# Patient Record
Sex: Female | Born: 1985 | Race: White | Hispanic: No | Marital: Married | Smoking: Never smoker
Health system: Southern US, Community
[De-identification: ages and names within clinical notes are randomized; demographics above are authoritative.]

## PROBLEM LIST (undated history)

## (undated) ENCOUNTER — Inpatient Hospital Stay (HOSPITAL_COMMUNITY): Payer: Self-pay

## (undated) DIAGNOSIS — Z789 Other specified health status: Secondary | ICD-10-CM

## (undated) DIAGNOSIS — J45909 Unspecified asthma, uncomplicated: Secondary | ICD-10-CM

## (undated) HISTORY — PX: NO PAST SURGERIES: SHX2092

## (undated) HISTORY — DX: Other specified health status: Z78.9

---

## 2017-05-16 NOTE — L&D Delivery Note (Addendum)
Patient is 32 y.o. G1P0 4853w2d admitted 7/14 S/p IOL with foley bulb, followed by Pitocin. AROM at 1737.  Prenatal course also complicated by fetal abnormality (cavum vergae).  Delivery Note At 6:28 PM a viable female was delivered via Vaginal, Spontaneous cephalic, OA delivery  APGAR: 8, 9; weight  pending.   Placenta status: intact.Cord: 3 vessel.  Head delivered OA. No nuchal cord present. Cord wrapped around both feet, untangled after delivery. Shoulder and body delivered in usual fashion. Infant with spontaneous cry, placed on mother's abdomen, dried and bulb suctioned. Cord clamped x 2 after 1-minute delay, and cut by FOB. Cord blood drawn. Placenta delivered spontaneously with gentle cord traction. Fundus firm with massage and Pitocin. Perineum inspected and found to have first degree laceration, which was repaired with 3.0 monocryl with good hemostasis achieved.   Anesthesia:  none Episiotomy: None Lacerations: 1st degree;Perineal;right Labial Suture Repair: 3.0 monocryl. 4.0 monocryl Est. Blood Loss (mL):  200  Mom to postpartum.  Baby to Couplet care / Skin to Skin.  Sandre KittyDaniel K Olson 11/26/2017, 7:27 PM  OB FELLOW DELIVERY ATTESTATION  I was gloved and present for the delivery in its entirety, and I agree with the above resident's note.    Frederik PearJulie P Degele, MD OB Fellow

## 2017-05-22 ENCOUNTER — Encounter: Payer: Self-pay | Admitting: Obstetrics & Gynecology

## 2017-05-22 ENCOUNTER — Ambulatory Visit (INDEPENDENT_AMBULATORY_CARE_PROVIDER_SITE_OTHER): Payer: Managed Care, Other (non HMO) | Admitting: Obstetrics & Gynecology

## 2017-05-22 VITALS — BP 124/64 | HR 65 | Ht 67.5 in | Wt 176.0 lb

## 2017-05-22 DIAGNOSIS — O9989 Other specified diseases and conditions complicating pregnancy, childbirth and the puerperium: Secondary | ICD-10-CM

## 2017-05-22 DIAGNOSIS — Z3687 Encounter for antenatal screening for uncertain dates: Secondary | ICD-10-CM

## 2017-05-22 DIAGNOSIS — Z3481 Encounter for supervision of other normal pregnancy, first trimester: Secondary | ICD-10-CM | POA: Diagnosis not present

## 2017-05-22 DIAGNOSIS — O0990 Supervision of high risk pregnancy, unspecified, unspecified trimester: Secondary | ICD-10-CM | POA: Insufficient documentation

## 2017-05-22 DIAGNOSIS — Z113 Encounter for screening for infections with a predominantly sexual mode of transmission: Secondary | ICD-10-CM | POA: Diagnosis not present

## 2017-05-22 DIAGNOSIS — Z3490 Encounter for supervision of normal pregnancy, unspecified, unspecified trimester: Secondary | ICD-10-CM

## 2017-05-22 DIAGNOSIS — B373 Candidiasis of vulva and vagina: Secondary | ICD-10-CM | POA: Diagnosis not present

## 2017-05-22 DIAGNOSIS — B3731 Acute candidiasis of vulva and vagina: Secondary | ICD-10-CM

## 2017-05-22 DIAGNOSIS — Z3480 Encounter for supervision of other normal pregnancy, unspecified trimester: Secondary | ICD-10-CM

## 2017-05-22 MED ORDER — CLOTRIMAZOLE 1 % VA CREA: 1.0000 | TOPICAL_CREAM | Freq: Every day | VAGINAL | 1 refills | Status: DC

## 2017-05-22 NOTE — Patient Instructions (Signed)

## 2017-05-22 NOTE — Progress Notes (Signed)
DATING AND VIABILITY SONOGRAM   Erica Shelton is a 32 y.o. year old G1P0 with LMP Patient's last menstrual period was 02/17/2017 (exact date). which would correlate to  2254w3d weeks gestation.  She has regular menstrual cycles.   She is here today for a confirmatory initial sonogram.    GESTATION: SINGLETON     FETAL ACTIVITY:          Heart rate        156          The fetus is active.   ADNEXA: The ovaries are normal.   GESTATIONAL AGE AND  BIOMETRICS:  Gestational criteria: Estimated Date of Delivery: 11/24/17 by early ultrasound now at 7730w1d  Previous Scans:0  CROWN RUMP LENGTH           mm     13-1 weeks                                                                               AVERAGE EGA(BY THIS SCAN):  13-1 weeks  WORKING EDD( early ultrasound ):  11-24-17    Erica StammerJennifer Mckinnon Glick RN 05-22-17 8:49 am   Attestation of Attending Supervision of RN: Evaluation and management procedures were performed by the nurse under my supervision and collaboration.  I have reviewed the nursing note and chart, and I agree with the management and plan.  Erica Shelton, M.D., FACOG  .

## 2017-05-22 NOTE — Progress Notes (Signed)
  Subjective:    Erica Shelton is being seen today for her first obstetrical visit. G1 LMP Feb 17, 2017.  Pt has 28 day cycles    This is not a planned pregnancy.But, pt is excited about it She is at 2926w3d gestation. Her obstetrical history is significant for no problems. Relationship with FOB: significant other, living together. Patient  pt is considering breast feedign vs pumping.  intend to breast feed. Pregnancy history fully reviewed.  FOB: healthy. No children.   Patient reports vaginal irritation and discharge.  Pt was in PT at the age of 18 years for hp pain. She was told that if she was ever on birth control she may need a brace.     Review of Systems:   Review of Systems [place]; pt exercises a lot  Objective:     BP 124/64   Pulse 65   Ht 5' 7.5" (1.715 m)   Wt 176 lb (79.8 kg)   LMP 02/17/2017 (Exact Date)   BMI 27.16 kg/m  Physical Exam  Exam  General Appearance:    Alert, cooperative, no distress, appears stated age  Head:    Normocephalic, without obvious abnormality, atraumatic  Eyes:    conjunctiva/corneas clear, EOM's intact, both eyes  Ears:    Normal external ear canals, both ears  Nose:   Nares normal, septum midline, mucosa normal, no drainage    or sinus tenderness  Throat:   Lips, mucosa, and tongue normal; teeth and gums normal  Neck:   Supple, symmetrical, trachea midline, no adenopathy;    thyroid:  no enlargement/tenderness/nodules  Back:     Symmetric, no curvature, ROM normal, no CVA tenderness  Lungs:     Clear to auscultation bilaterally, respirations unlabored  Chest Wall:    No tenderness or deformity   Heart:    Regular rate and rhythm, S1 and S2 normal, no murmur, rub   or gallop  Breast Exam:    No tenderness, masses, or nipple abnormality  Abdomen:     Soft, non-tender, bowel sounds active all four quadrants,    no masses, no organomegaly  Genitalia:    Normal female without lesion, discharge or tenderness; there is a white clumpy discharge  consistent with yeast.  Uterus c/w 13-14 week IUP     Extremities:   Extremities normal, atraumatic, no cyanosis or edema  Pulses:   2+ and symmetric all extremities  Skin:   Skin color, texture, turgor normal, no rashes or lesions       Assessment:    Pregnancy: G1P0 Patient Active Problem List   Diagnosis Date Noted  . Supervision of other normal pregnancy, antepartum 05/22/2017  candidal vulvovaginits.      Plan:     Initial labs drawn. Prenatal vitamins. Problem list reviewed and updated. AFP3 discussed: requested. Role of ultrasound in pregnancy discussed; fetal survey: requested. Amniocentesis discussed: not indicated. Follow up in 4 weeks. 60% of 60 min visit spent on counseling and coordination of care.  Clotrimazole intravaginal cream   Willodean RosenthalCarolyn Harraway-Smith 05/22/2017

## 2017-05-23 LAB — CERVICOVAGINAL ANCILLARY ONLY
Bacterial vaginitis: NEGATIVE
CANDIDA VAGINITIS: POSITIVE — AB
CHLAMYDIA, DNA PROBE: NEGATIVE
Neisseria Gonorrhea: NEGATIVE

## 2017-05-24 LAB — OBSTETRIC PANEL, INCLUDING HIV
Antibody Screen: NEGATIVE
Basophils Absolute: 0 10*3/uL (ref 0.0–0.2)
Basos: 0 %
EOS (ABSOLUTE): 0 10*3/uL (ref 0.0–0.4)
EOS: 0 %
HEMOGLOBIN: 12.1 g/dL (ref 11.1–15.9)
HEP B S AG: NEGATIVE
HIV Screen 4th Generation wRfx: NONREACTIVE
Hematocrit: 37.1 % (ref 34.0–46.6)
IMMATURE GRANS (ABS): 0 10*3/uL (ref 0.0–0.1)
IMMATURE GRANULOCYTES: 0 %
LYMPHS: 16 %
Lymphocytes Absolute: 1.6 10*3/uL (ref 0.7–3.1)
MCH: 30.3 pg (ref 26.6–33.0)
MCHC: 32.6 g/dL (ref 31.5–35.7)
MCV: 93 fL (ref 79–97)
MONOCYTES: 4 %
Monocytes Absolute: 0.4 10*3/uL (ref 0.1–0.9)
Neutrophils Absolute: 8.2 10*3/uL — ABNORMAL HIGH (ref 1.4–7.0)
Neutrophils: 80 %
Platelets: 227 10*3/uL (ref 150–379)
RBC: 3.99 x10E6/uL (ref 3.77–5.28)
RDW: 14 % (ref 12.3–15.4)
RH TYPE: POSITIVE
RPR: NONREACTIVE
RUBELLA: 0.98 {index} — AB (ref >0.99)
WBC: 10.2 10*3/uL (ref 3.4–10.8)

## 2017-05-24 LAB — URINE CULTURE: Organism ID, Bacteria: NO GROWTH

## 2017-06-02 DIAGNOSIS — Z3480 Encounter for supervision of other normal pregnancy, unspecified trimester: Secondary | ICD-10-CM

## 2017-06-04 ENCOUNTER — Encounter: Payer: Self-pay | Admitting: *Deleted

## 2017-06-06 ENCOUNTER — Encounter: Payer: Self-pay | Admitting: Obstetrics & Gynecology

## 2017-06-27 ENCOUNTER — Encounter (HOSPITAL_COMMUNITY): Payer: Self-pay | Admitting: Obstetrics & Gynecology

## 2017-07-03 ENCOUNTER — Other Ambulatory Visit: Payer: Self-pay | Admitting: Obstetrics & Gynecology

## 2017-07-03 ENCOUNTER — Ambulatory Visit (HOSPITAL_COMMUNITY)
Admission: RE | Admit: 2017-07-03 | Discharge: 2017-07-03 | Disposition: A | Discharge status: Home / Self Care | Payer: Managed Care, Other (non HMO) | Source: Ambulatory Visit | Attending: Obstetrics & Gynecology | Admitting: Obstetrics & Gynecology

## 2017-07-03 DIAGNOSIS — Z3A19 19 weeks gestation of pregnancy: Secondary | ICD-10-CM

## 2017-07-03 DIAGNOSIS — Z3689 Encounter for other specified antenatal screening: Secondary | ICD-10-CM | POA: Diagnosis not present

## 2017-07-03 DIAGNOSIS — Z3490 Encounter for supervision of normal pregnancy, unspecified, unspecified trimester: Secondary | ICD-10-CM

## 2017-07-13 ENCOUNTER — Ambulatory Visit (INDEPENDENT_AMBULATORY_CARE_PROVIDER_SITE_OTHER): Payer: Managed Care, Other (non HMO) | Admitting: Family Medicine

## 2017-07-13 VITALS — BP 118/63 | HR 63 | Wt 193.0 lb

## 2017-07-13 DIAGNOSIS — Z3482 Encounter for supervision of other normal pregnancy, second trimester: Secondary | ICD-10-CM

## 2017-07-13 DIAGNOSIS — Z3480 Encounter for supervision of other normal pregnancy, unspecified trimester: Secondary | ICD-10-CM

## 2017-07-13 DIAGNOSIS — Z789 Other specified health status: Secondary | ICD-10-CM

## 2017-07-13 NOTE — Progress Notes (Addendum)
   PRENATAL VISIT NOTE  Subjective:  Madiha Bambrick is a 32 y.o. Z2Y4 at 70w6dbeing seen today for ongoing prenatal care.  She is currently monitored for the following issues for this low-risk pregnancy and has Supervision of other normal pregnancy, antepartum on their problem list.  Patient reports no complaints and occasional numbness on left lateral thigh.  Contractions: Not present. Vag. Bleeding: None.  Movement: Present. Denies leaking of fluid.   The following portions of the patient's history were reviewed and updated as appropriate: allergies, current medications, past family history, past medical history, past social history, past surgical history and problem list. Problem list updated.  Objective:   Vitals:   07/13/17 0812  BP: 118/63  Pulse: 63  Weight: 193 lb (87.5 kg)    Fetal Status: Fetal Heart Rate (bpm): 140 Fundal Height: 28 cm Movement: Present     General:  Alert, oriented and cooperative. Patient is in no acute distress.  Skin: Skin is warm and dry. No rash noted.   Cardiovascular: Normal heart rate noted  Respiratory: Normal respiratory effort, no problems with respiration noted  Abdomen: Soft, gravid, appropriate for gestational age.  Pain/Pressure: Absent     Pelvic: Cervical exam deferred        Extremities: Normal range of motion.  Edema: None  Mental Status:  Normal mood and affect. Normal behavior. Normal judgment and thought content.   Assessment and Plan:  Pregnancy: G1P0 at 225w6d1. Supervision of other normal pregnancy, antepartum FHT and FH. Spine, aortic arch, cord not well visualized.  2. Rubella immune status not known Equivocal, just below cutoff. Will repeat with 28 week labs. If still equivocal, MMR postpartum   Preterm labor symptoms and general obstetric precautions including but not limited to vaginal bleeding, contractions, leaking of fluid and fetal movement were reviewed in detail with the patient. Please refer to After Visit  Summary for other counseling recommendations.  Return in about 8 weeks (around 09/07/2017).   JaTruett MainlandDO

## 2017-08-14 ENCOUNTER — Other Ambulatory Visit (HOSPITAL_COMMUNITY): Payer: Self-pay | Admitting: *Deleted

## 2017-08-14 ENCOUNTER — Ambulatory Visit (HOSPITAL_COMMUNITY)
Admission: RE | Admit: 2017-08-14 | Discharge: 2017-08-14 | Disposition: A | Discharge status: Home / Self Care | Payer: Managed Care, Other (non HMO) | Source: Ambulatory Visit | Attending: Obstetrics & Gynecology | Admitting: Obstetrics & Gynecology

## 2017-08-14 ENCOUNTER — Other Ambulatory Visit: Payer: Self-pay | Admitting: Obstetrics & Gynecology

## 2017-08-14 DIAGNOSIS — Z3A19 19 weeks gestation of pregnancy: Secondary | ICD-10-CM

## 2017-08-14 DIAGNOSIS — O359XX0 Maternal care for (suspected) fetal abnormality and damage, unspecified, not applicable or unspecified: Secondary | ICD-10-CM

## 2017-08-14 DIAGNOSIS — Z3A25 25 weeks gestation of pregnancy: Secondary | ICD-10-CM

## 2017-08-14 DIAGNOSIS — Z362 Encounter for other antenatal screening follow-up: Secondary | ICD-10-CM | POA: Insufficient documentation

## 2017-08-14 DIAGNOSIS — Z3689 Encounter for other specified antenatal screening: Secondary | ICD-10-CM

## 2017-08-14 NOTE — Addendum Note (Signed)
Encounter addended by: Melynda KellerVics, Joely Losier R, RDMS on: 08/14/2017 9:45 AM  Actions taken: Imaging Exam ended

## 2017-08-17 ENCOUNTER — Encounter: Payer: Self-pay | Admitting: Obstetrics & Gynecology

## 2017-08-18 ENCOUNTER — Encounter: Payer: Self-pay | Admitting: Obstetrics & Gynecology

## 2017-08-21 ENCOUNTER — Telehealth: Payer: Self-pay | Admitting: Obstetrics & Gynecology

## 2017-08-21 NOTE — Telephone Encounter (Signed)
TC to pt. She cancelled her fetal MRI due to cost and the uncertainty that was explained to her about being about to see the anatomy of the fetus. I spoke to Dr. Sherrie Georgeecker who reviewed the images and said that it is very reasonable NOT to do the MRI. The pt agrees to an aneupoidy study which was also recommended. She will go to the ofc for a Panorama and her 28 week labs and f/u 4/29 with Dr. Sherrie Georgeecker for a repeat US.  She would like to move her next appt with me to after her US results come in.   All questions answered.   Pam Vanalstine L. Harraway-Smith, M.D., Evern CoreFACOG

## 2017-08-28 ENCOUNTER — Other Ambulatory Visit: Payer: Managed Care, Other (non HMO)

## 2017-08-28 DIAGNOSIS — Z3490 Encounter for supervision of normal pregnancy, unspecified, unspecified trimester: Secondary | ICD-10-CM

## 2017-08-28 NOTE — Progress Notes (Signed)
Patient sent to lab for glucose testing labwork. Erica StammerJennifer Kaytee Taliercio RN

## 2017-08-29 ENCOUNTER — Encounter (INDEPENDENT_AMBULATORY_CARE_PROVIDER_SITE_OTHER): Payer: Self-pay

## 2017-08-29 LAB — CBC
HEMOGLOBIN: 10.9 g/dL — AB (ref 11.1–15.9)
Hematocrit: 33.5 % — ABNORMAL LOW (ref 34.0–46.6)
MCH: 30.6 pg (ref 26.6–33.0)
MCHC: 32.5 g/dL (ref 31.5–35.7)
MCV: 94 fL (ref 79–97)
Platelets: 266 10*3/uL (ref 150–379)
RBC: 3.56 x10E6/uL — AB (ref 3.77–5.28)
RDW: 13.1 % (ref 12.3–15.4)
WBC: 10.1 10*3/uL (ref 3.4–10.8)

## 2017-08-29 LAB — HIV ANTIBODY (ROUTINE TESTING W REFLEX): HIV Screen 4th Generation wRfx: NONREACTIVE

## 2017-08-29 LAB — RPR: RPR Ser Ql: NONREACTIVE

## 2017-08-29 LAB — GLUCOSE TOLERANCE, 2 HOURS W/ 1HR
GLUCOSE, 1 HOUR: 75 mg/dL (ref 65–179)
GLUCOSE, FASTING: 73 mg/dL (ref 65–91)
Glucose, 2 hour: 85 mg/dL (ref 65–152)

## 2017-09-04 ENCOUNTER — Encounter: Payer: Self-pay | Admitting: Obstetrics & Gynecology

## 2017-09-07 ENCOUNTER — Encounter: Payer: Managed Care, Other (non HMO) | Admitting: Obstetrics & Gynecology

## 2017-09-11 ENCOUNTER — Other Ambulatory Visit (HOSPITAL_COMMUNITY): Payer: Self-pay | Admitting: Obstetrics and Gynecology

## 2017-09-11 ENCOUNTER — Other Ambulatory Visit (HOSPITAL_COMMUNITY): Payer: Self-pay | Admitting: *Deleted

## 2017-09-11 ENCOUNTER — Ambulatory Visit (HOSPITAL_COMMUNITY)
Admission: RE | Admit: 2017-09-11 | Discharge: 2017-09-11 | Disposition: A | Discharge status: Home / Self Care | Payer: Managed Care, Other (non HMO) | Source: Ambulatory Visit | Attending: Obstetrics & Gynecology | Admitting: Obstetrics & Gynecology

## 2017-09-11 ENCOUNTER — Encounter (HOSPITAL_COMMUNITY): Payer: Self-pay

## 2017-09-11 DIAGNOSIS — Z3A29 29 weeks gestation of pregnancy: Secondary | ICD-10-CM

## 2017-09-11 DIAGNOSIS — O359XX0 Maternal care for (suspected) fetal abnormality and damage, unspecified, not applicable or unspecified: Secondary | ICD-10-CM

## 2017-09-11 DIAGNOSIS — Z362 Encounter for other antenatal screening follow-up: Secondary | ICD-10-CM

## 2017-09-11 DIAGNOSIS — O358XX0 Maternal care for other (suspected) fetal abnormality and damage, not applicable or unspecified: Secondary | ICD-10-CM | POA: Diagnosis present

## 2017-09-20 ENCOUNTER — Ambulatory Visit (INDEPENDENT_AMBULATORY_CARE_PROVIDER_SITE_OTHER): Payer: Managed Care, Other (non HMO) | Admitting: Obstetrics & Gynecology

## 2017-09-20 ENCOUNTER — Encounter: Payer: Managed Care, Other (non HMO) | Admitting: Obstetrics & Gynecology

## 2017-09-20 VITALS — BP 115/64 | HR 77 | Wt 214.0 lb

## 2017-09-20 DIAGNOSIS — Z23 Encounter for immunization: Secondary | ICD-10-CM

## 2017-09-20 DIAGNOSIS — O359XX0 Maternal care for (suspected) fetal abnormality and damage, unspecified, not applicable or unspecified: Secondary | ICD-10-CM

## 2017-09-20 DIAGNOSIS — Z3480 Encounter for supervision of other normal pregnancy, unspecified trimester: Secondary | ICD-10-CM

## 2017-09-20 DIAGNOSIS — Z3483 Encounter for supervision of other normal pregnancy, third trimester: Secondary | ICD-10-CM

## 2017-09-20 NOTE — Progress Notes (Signed)
   PRENATAL VISIT NOTE  Subjective:  Erica Shelton is a 32 y.o. G1P0 at [redacted]w[redacted]d being seen today for ongoing prenatal care.  She is currently monitored for the following issues for this low-risk pregnancy and has Supervision of other normal pregnancy, antepartum and Prior exam suggested fetal anomaly, antepartum on their problem list.  Patient reports no complaints.  Contractions: Not present. Vag. Bleeding: None.  Movement: Present. Denies leaking of fluid.   The following portions of the patient's history were reviewed and updated as appropriate: allergies, current medications, past family history, past medical history, past social history, past surgical history and problem list. Problem list updated.  Objective:   Vitals:   09/20/17 0836  BP: 115/64  Pulse: 77  Weight: 214 lb (97.1 kg)    Fetal Status: Fetal Heart Rate (bpm): 140   Movement: Present     General:  Alert, oriented and cooperative. Patient is in no acute distress.  Skin: Skin is warm and dry. No rash noted.   Cardiovascular: Normal heart rate noted  Respiratory: Normal respiratory effort, no problems with respiration noted  Abdomen: Soft, gravid, appropriate for gestational age.  Pain/Pressure: Present     Pelvic: Cervical exam deferred        Extremities: Normal range of motion.  Edema: None  Mental Status: Normal mood and affect. Normal behavior. Normal judgment and thought content.   Assessment and Plan:  Pregnancy: G1P0 at [redacted]w[redacted]d  1. Supervision of other normal pregnancy, antepartum S/p Tdap today   2. Prior exam suggested fetal anomaly, antepartum, single or unspecified fetus Pt was seen 09/11/2017 by Dr. Sherrie George. The ventricculomegaly has not increased per pt however, the full report is not in the chart. Will contact MFM for full report.   Preterm labor symptoms and general obstetric precautions including but not limited to vaginal bleeding, contractions, leaking of fluid and fetal movement were reviewed in  detail with the patient. Please refer to After Visit Summary for other counseling recommendations.  Return in about 2 weeks (around 10/04/2017).  Future Appointments  Date Time Provider Department Center  10/23/2017  7:45 AM [facility] [place] 2 [facility]US [facility]-US    Willodean Rosenthal, MD

## 2017-10-11 ENCOUNTER — Encounter: Payer: Self-pay | Admitting: Obstetrics & Gynecology

## 2017-10-11 ENCOUNTER — Ambulatory Visit (INDEPENDENT_AMBULATORY_CARE_PROVIDER_SITE_OTHER): Payer: Managed Care, Other (non HMO) | Admitting: Obstetrics & Gynecology

## 2017-10-11 VITALS — BP 130/70 | HR 72 | Wt 217.0 lb

## 2017-10-11 DIAGNOSIS — O359XX0 Maternal care for (suspected) fetal abnormality and damage, unspecified, not applicable or unspecified: Secondary | ICD-10-CM

## 2017-10-11 DIAGNOSIS — Z3480 Encounter for supervision of other normal pregnancy, unspecified trimester: Secondary | ICD-10-CM

## 2017-10-11 MED ORDER — MISC. DEVICES KIT: PACK | 0 refills | Status: DC

## 2017-10-11 NOTE — Patient Instructions (Signed)

## 2017-10-11 NOTE — Addendum Note (Signed)
Addended by: Anell Barr on: 10/11/2017 04:22 PM   Modules accepted: Orders

## 2017-10-11 NOTE — Progress Notes (Signed)
   PRENATAL VISIT NOTE  Subjective:  Erica Shelton is a 32 y.o. G1P0 at [redacted]w[redacted]d being seen today for ongoing prenatal care.  She is currently monitored for the following issues for this high-risk pregnancy and has Supervision of other normal pregnancy, antepartum and Prior exam suggested fetal anomaly, antepartum on their problem list.  Patient reports no complaints.  Contractions: Not present. Vag. Bleeding: None.  Movement: Present. Denies leaking of fluid.   The following portions of the patient's history were reviewed and updated as appropriate: allergies, current medications, past family history, past medical history, past social history, past surgical history and problem list. Problem list updated.  Objective:   Vitals:   10/11/17 1548  BP: 130/70  Pulse: 72  Weight: 217 lb (98.4 kg)    Fetal Status: Fetal Heart Rate (bpm): 140   Movement: Present     General:  Alert, oriented and cooperative. Patient is in no acute distress.  Skin: Skin is warm and dry. No rash noted.   Cardiovascular: Normal heart rate noted  Respiratory: Normal respiratory effort, no problems with respiration noted  Abdomen: Soft, gravid, appropriate for gestational age.  Pain/Pressure: Present     Pelvic: Cervical exam deferred        Extremities: Normal range of motion.  Edema: None  Mental Status: Normal mood and affect. Normal behavior. Normal judgment and thought content.   Assessment and Plan:  Pregnancy: G1P0 at [redacted]w[redacted]d  1. Supervision of other normal pregnancy, antepartum  2. Prior exam suggested fetal anomaly, antepartum, single or unspecified fetus Has f/u US scheduled for 10/23/2017 to recheck anomaly and assess S>D dates    Preterm labor symptoms and general obstetric precautions including but not limited to vaginal bleeding, contractions, leaking of fluid and fetal movement were reviewed in detail with the patient. Please refer to After Visit Summary for other counseling recommendations.    Return in about 2 weeks (around 10/25/2017).  Future Appointments  Date Time Provider Department Center  10/23/2017  7:45 AM [facility] [place] 2 [facility]US [facility]-US    Willodean Rosenthal, MD

## 2017-10-23 ENCOUNTER — Encounter (HOSPITAL_COMMUNITY): Payer: Self-pay

## 2017-10-23 ENCOUNTER — Other Ambulatory Visit (HOSPITAL_COMMUNITY): Payer: Self-pay | Admitting: Maternal and Fetal Medicine

## 2017-10-23 ENCOUNTER — Ambulatory Visit (HOSPITAL_COMMUNITY)
Admission: RE | Admit: 2017-10-23 | Discharge: 2017-10-23 | Disposition: A | Discharge status: Home / Self Care | Payer: Managed Care, Other (non HMO) | Source: Ambulatory Visit | Attending: Obstetrics & Gynecology | Admitting: Obstetrics & Gynecology

## 2017-10-23 DIAGNOSIS — Z3A35 35 weeks gestation of pregnancy: Secondary | ICD-10-CM | POA: Insufficient documentation

## 2017-10-23 DIAGNOSIS — O359XX0 Maternal care for (suspected) fetal abnormality and damage, unspecified, not applicable or unspecified: Secondary | ICD-10-CM | POA: Diagnosis not present

## 2017-10-23 DIAGNOSIS — Z0000 Encounter for general adult medical examination without abnormal findings: Secondary | ICD-10-CM | POA: Insufficient documentation

## 2017-10-23 DIAGNOSIS — Z362 Encounter for other antenatal screening follow-up: Secondary | ICD-10-CM

## 2017-10-25 ENCOUNTER — Ambulatory Visit (INDEPENDENT_AMBULATORY_CARE_PROVIDER_SITE_OTHER): Payer: Managed Care, Other (non HMO) | Admitting: Family Medicine

## 2017-10-25 VITALS — BP 118/67 | HR 71 | Wt 221.0 lb

## 2017-10-25 DIAGNOSIS — O359XX0 Maternal care for (suspected) fetal abnormality and damage, unspecified, not applicable or unspecified: Secondary | ICD-10-CM

## 2017-10-25 DIAGNOSIS — Z3480 Encounter for supervision of other normal pregnancy, unspecified trimester: Secondary | ICD-10-CM

## 2017-10-25 DIAGNOSIS — Z029 Encounter for administrative examinations, unspecified: Secondary | ICD-10-CM

## 2017-10-25 DIAGNOSIS — Z113 Encounter for screening for infections with a predominantly sexual mode of transmission: Secondary | ICD-10-CM

## 2017-10-25 DIAGNOSIS — Z3490 Encounter for supervision of normal pregnancy, unspecified, unspecified trimester: Secondary | ICD-10-CM

## 2017-10-25 LAB — OB RESULTS CONSOLE GC/CHLAMYDIA: Gonorrhea: NEGATIVE

## 2017-10-25 LAB — OB RESULTS CONSOLE GBS: STREP GROUP B AG: NEGATIVE

## 2017-10-25 NOTE — Patient Instructions (Signed)

## 2017-10-26 NOTE — Progress Notes (Signed)
   PRENATAL VISIT NOTE  Subjective:  Erica Shelton is a 32 y.o. G1P0 at 8772w6d being seen today for ongoing prenatal care.  She is currently monitored for the following issues for this high-risk pregnancy and has Supervision of other normal pregnancy, antepartum; Prior exam suggested fetal anomaly, antepartum; and Fetal abnormality affecting management of mother on their problem list.  Patient reports no complaints.  Contractions: Not present. Vag. Bleeding: None.  Movement: Present. Denies leaking of fluid.   The following portions of the patient's history were reviewed and updated as appropriate: allergies, current medications, past family history, past medical history, past social history, past surgical history and problem list. Problem list updated.  Objective:   Vitals:   10/25/17 1545  BP: 118/67  Pulse: 71  Weight: 221 lb (100.2 kg)    Fetal Status: Fetal Heart Rate (bpm): 135 Fundal Height: 38 cm Movement: Present     General:  Alert, oriented and cooperative. Patient is in no acute distress.  Skin: Skin is warm and dry. No rash noted.   Cardiovascular: Normal heart rate noted  Respiratory: Normal respiratory effort, no problems with respiration noted  Abdomen: Soft, gravid, appropriate for gestational age.  Pain/Pressure: Present     Pelvic: Cervical exam deferred Dilation: Closed Effacement (%): 50 Station: -2  Extremities: Normal range of motion.  Edema: None  Mental Status: Normal mood and affect. Normal behavior. Normal judgment and thought content.   Assessment and Plan:  Pregnancy: G1P0 at 6172w6d  1. Prenatal care, antepartum Cultures today - Culture, beta strep (group b only) - GC[facility]Chlamydia probe amp [facility]  2. Supervision of other normal pregnancy, antepartum - US MFM OB FOLLOW UP; Future  3. Fetal abnormality affecting management of mother, single or unspecified fetus F/u u/s scheduled  Preterm labor symptoms and general obstetric  precautions including but not limited to vaginal bleeding, contractions, leaking of fluid and fetal movement were reviewed in detail with the patient. Please refer to After Visit Summary for other counseling recommendations.  Return in about 2 weeks (around 11/08/2017).  Future Appointments  Date Time Provider Department Center  11/07/2017  8:25 AM Dorathy KinsmanSmith, [place], CNM [facility] None    Reva Boresanya S Eh Sesay, MD

## 2017-10-27 LAB — GC/CHLAMYDIA PROBE AMP (~~LOC~~) NOT AT ARMC
Chlamydia: NEGATIVE
NEISSERIA GONORRHEA: NEGATIVE

## 2017-10-29 LAB — CULTURE, BETA STREP (GROUP B ONLY): Strep Gp B Culture: NEGATIVE

## 2017-11-07 ENCOUNTER — Ambulatory Visit (INDEPENDENT_AMBULATORY_CARE_PROVIDER_SITE_OTHER): Payer: Managed Care, Other (non HMO) | Admitting: Advanced Practice Midwife

## 2017-11-07 ENCOUNTER — Encounter: Payer: Self-pay | Admitting: Advanced Practice Midwife

## 2017-11-07 VITALS — BP 121/73 | HR 74 | Wt 221.0 lb

## 2017-11-07 DIAGNOSIS — Z3A38 38 weeks gestation of pregnancy: Secondary | ICD-10-CM

## 2017-11-07 DIAGNOSIS — O0993 Supervision of high risk pregnancy, unspecified, third trimester: Secondary | ICD-10-CM

## 2017-11-07 DIAGNOSIS — O359XX0 Maternal care for (suspected) fetal abnormality and damage, unspecified, not applicable or unspecified: Secondary | ICD-10-CM

## 2017-11-07 DIAGNOSIS — O0990 Supervision of high risk pregnancy, unspecified, unspecified trimester: Secondary | ICD-10-CM

## 2017-11-07 DIAGNOSIS — Z362 Encounter for other antenatal screening follow-up: Secondary | ICD-10-CM

## 2017-11-09 ENCOUNTER — Encounter: Payer: Managed Care, Other (non HMO) | Admitting: Family Medicine

## 2017-11-09 ENCOUNTER — Telehealth: Payer: Self-pay

## 2017-11-09 NOTE — Telephone Encounter (Signed)
Dara from Dr. Donna Christenoucher's office called to get the dx of pt's baby and to see if pt will need to be followed after baby is born. Lynwood DawleyDara stated that she will talk to her provider to decide what to do next. Per Dara's request a copy of pt's US was faxed to Dr. Donna Christenoucher's office.

## 2017-11-09 NOTE — Progress Notes (Signed)
   PRENATAL VISIT NOTE  Subjective:  Erica Shelton is a 32 y.o. G1P0 at 7162w4d being seen today for ongoing prenatal care.  She is currently monitored for the following issues for this high-risk pregnancy and has Supervision of high risk pregnancy, antepartum; Prior exam suggested fetal anomaly, antepartum; and Fetal abnormality affecting management of mother on their problem list.  Patient reports no complaints.  Contractions: Not present.  .  Movement: Present. Denies leaking of fluid.   The following portions of the patient's history were reviewed and updated as appropriate: allergies, current medications, past family history, past medical history, past social history, past surgical history and problem list. Problem list updated.  [facility]   Objective:   Vitals:   11/07/17 0824  BP: 121/73  Pulse: 74  Weight: 221 lb (100.2 kg)    Fetal Status:     Movement: Present     General:  Alert, oriented and cooperative. Patient is in no acute distress.  Skin: Skin is warm and dry. No rash noted.   Cardiovascular: Normal heart rate noted  Respiratory: Normal respiratory effort, no problems with respiration noted  Abdomen: Soft, gravid, appropriate for gestational age.  Pain/Pressure: Present     Pelvic: Cervical exam deferred        Extremities: Normal range of motion.  Edema: None  Mental Status: Normal mood and affect. Normal behavior. Normal judgment and thought content.   Assessment and Plan:  Pregnancy: G1P0 at 744w6d  1. Supervision of high risk pregnancy, antepartum  - Ambulatory referral to Pediatric Neurology - Ambulatory referral to Pediatric Urology - Ambulatory referral to Neurosurgery - US MFM OB FOLLOW UP; Future  2. Fetal abnormality affecting management of mother, single or unspecified fetus  - Ambulatory referral to Pediatric Neurology - Ambulatory referral to Pediatric Urology -  Ambulatory referral to Neurosurgery - US MFM OB FOLLOW UP; Future  3. Encounter for other antenatal screening follow-up  - Ambulatory referral to Neurosurgery - US MFM OB FOLLOW UP; Future  4. [redacted] weeks gestation of pregnancy  - Ambulatory referral to Neurosurgery - US MFM OB FOLLOW UP; Future  Term labor symptoms and general obstetric precautions including but not limited to vaginal bleeding, contractions, leaking of fluid and fetal movement were reviewed in detail with the patient. Please refer to After Visit Summary for other counseling recommendations.  No follow-ups on file.  Future Appointments  Date Time Provider Department Center  11/15/2017  9:00 AM [facility] US 3 WH-M[facility]US [facility]-US  11/17/2017  8:30 AM Anyanwu, Jethro BastosUgonna A, MD [facility] None    Dorathy KinsmanVirginia Aleen Marston, CNM

## 2017-11-10 ENCOUNTER — Encounter: Payer: Self-pay | Admitting: Obstetrics & Gynecology

## 2017-11-13 NOTE — Progress Notes (Signed)
Pt sent an email requesting that we cancel her US scheduled for 11/15/17. Pt states that she she doesn't want to be stressed out and prefers to see the specialist after her baby is born. [place] was canceled on 11/13/17.

## 2017-11-14 ENCOUNTER — Ambulatory Visit (HOSPITAL_COMMUNITY): Payer: Managed Care, Other (non HMO)

## 2017-11-15 ENCOUNTER — Ambulatory Visit (HOSPITAL_COMMUNITY): Payer: Managed Care, Other (non HMO)

## 2017-11-17 ENCOUNTER — Ambulatory Visit (INDEPENDENT_AMBULATORY_CARE_PROVIDER_SITE_OTHER): Payer: Managed Care, Other (non HMO) | Admitting: Obstetrics & Gynecology

## 2017-11-17 VITALS — BP 125/73 | HR 75 | Wt 222.1 lb

## 2017-11-17 DIAGNOSIS — O359XX0 Maternal care for (suspected) fetal abnormality and damage, unspecified, not applicable or unspecified: Secondary | ICD-10-CM

## 2017-11-17 DIAGNOSIS — O0993 Supervision of high risk pregnancy, unspecified, third trimester: Secondary | ICD-10-CM

## 2017-11-17 DIAGNOSIS — O0990 Supervision of high risk pregnancy, unspecified, unspecified trimester: Secondary | ICD-10-CM

## 2017-11-17 NOTE — Progress Notes (Signed)
   PRENATAL VISIT NOTE  Subjective:  Erica Shelton is a 32 y.o. G1P0 at 8738w0d being seen today for ongoing prenatal care.  She is currently monitored for the following issues for this high-risk pregnancy and has Supervision of high risk pregnancy, antepartum and Fetal abnormality affecting management of mother on their problem list.  Patient reports no complaints.  Contractions: Irregular. Vag. Bleeding: None.  Movement: Present. Denies leaking of fluid.   The following portions of the patient's history were reviewed and updated as appropriate: allergies, current medications, past family history, past medical history, past social history, past surgical history and problem list. Problem list updated.  Objective:   Vitals:   11/17/17 0825  BP: 125/73  Pulse: 75  Weight: 222 lb 1.3 oz (100.7 kg)    Fetal Status: Fetal Heart Rate (bpm): 138 Fundal Height: 40 cm Movement: Present     General:  Alert, oriented and cooperative. Patient is in no acute distress.  Skin: Skin is warm and dry. No rash noted.   Cardiovascular: Normal heart rate noted  Respiratory: Normal respiratory effort, no problems with respiration noted  Abdomen: Soft, gravid, appropriate for gestational age.  Pain/Pressure: Present     Pelvic: Cervical exam deferred        Extremities: Normal range of motion.  Edema: Trace  Mental Status: Normal mood and affect. Normal behavior. Normal judgment and thought content.   Assessment and Plan:  Pregnancy: G1P0 at 2038w0d  1. Fetal abnormality affecting management of mother, single or unspecified fetus Follow up with Pediatric Neurosurgeon after birth.  2. Supervision of high risk pregnancy, antepartum Postdates testing to start next week. Term labor symptoms and general obstetric precautions including but not limited to vaginal bleeding, contractions, leaking of fluid and fetal movement were reviewed in detail with the patient. Please refer to After Visit Summary for other  counseling recommendations.  Return in about 1 week (around 11/24/2017) for OB Visit, NST, AFI (needs 2x/week testing next wee).  No future appointments.  Jaynie CollinsUgonna Abdulahi Schor, MD

## 2017-11-17 NOTE — Patient Instructions (Signed)
Return to clinic for any scheduled appointments or obstetric concerns, or go to [facility] for evaluation  

## 2017-11-21 ENCOUNTER — Ambulatory Visit (INDEPENDENT_AMBULATORY_CARE_PROVIDER_SITE_OTHER): Payer: Managed Care, Other (non HMO)

## 2017-11-21 VITALS — BP 124/76 | HR 76

## 2017-11-21 DIAGNOSIS — O359XX0 Maternal care for (suspected) fetal abnormality and damage, unspecified, not applicable or unspecified: Secondary | ICD-10-CM | POA: Diagnosis not present

## 2017-11-21 NOTE — Progress Notes (Signed)
Reviewed nonstress test done in office. NST reactive and reassuring, with uterine irritability noted  Agree with RN assessment  Will have her followup in office as scheduled Aviva SignsWilliams, Karsten Vaughn L, CNM

## 2017-11-21 NOTE — Progress Notes (Signed)
NST only visit. Jennifer Howard RN 

## 2017-11-24 ENCOUNTER — Ambulatory Visit (INDEPENDENT_AMBULATORY_CARE_PROVIDER_SITE_OTHER): Payer: Managed Care, Other (non HMO) | Admitting: Family Medicine

## 2017-11-24 VITALS — BP 114/68 | HR 71 | Wt 224.0 lb

## 2017-11-24 DIAGNOSIS — Z789 Other specified health status: Secondary | ICD-10-CM

## 2017-11-24 DIAGNOSIS — O0990 Supervision of high risk pregnancy, unspecified, unspecified trimester: Secondary | ICD-10-CM

## 2017-11-24 DIAGNOSIS — O359XX0 Maternal care for (suspected) fetal abnormality and damage, unspecified, not applicable or unspecified: Secondary | ICD-10-CM

## 2017-11-24 DIAGNOSIS — O353XX0 Maternal care for (suspected) damage to fetus from viral disease in mother, not applicable or unspecified: Secondary | ICD-10-CM

## 2017-11-24 DIAGNOSIS — O0993 Supervision of high risk pregnancy, unspecified, third trimester: Secondary | ICD-10-CM

## 2017-11-24 NOTE — Progress Notes (Signed)
   PRENATAL VISIT NOTE  Subjective:  Erica Shelton is a 32 y.o. H4V4 at 51w0dbeing seen today for ongoing prenatal care.  She is currently monitored for the following issues for this high-risk pregnancy and has Supervision of high risk pregnancy, antepartum and Fetal abnormality affecting management of mother on their problem list.  Patient reports no complaints.  Contractions: Irritability. Vag. Bleeding: None.  Movement: Present. Denies leaking of fluid.   The following portions of the patient's history were reviewed and updated as appropriate: allergies, current medications, past family history, past medical history, past social history, past surgical history and problem list. Problem list updated.  Objective:   Vitals:   11/24/17 0919  BP: 114/68  Pulse: 71  Weight: 224 lb (101.6 kg)    Fetal Status: Fetal Heart Rate (bpm): NST   Movement: Present  Presentation: Vertex  General:  Alert, oriented and cooperative. Patient is in no acute distress.  Skin: Skin is warm and dry. No rash noted.   Cardiovascular: Normal heart rate noted  Respiratory: Normal respiratory effort, no problems with respiration noted  Abdomen: Soft, gravid, appropriate for gestational age.  Pain/Pressure: Present     Pelvic: Cervical exam deferred        Extremities: Normal range of motion.  Edema: Trace  Mental Status: Normal mood and affect. Normal behavior. Normal judgment and thought content.   Assessment and Plan:  Pregnancy: G1P0 at 418w0d1. Supervision of high risk pregnancy, antepartum Induction scheduled for Sunday. Foley balloon placement night before  2. Fetal abnormality affecting management of mother, single or unspecified fetus Postpartum evaluation of baby.  3. Rubella immune status not known Needs MMR postpartum   Term labor symptoms and general obstetric precautions including but not limited to vaginal bleeding, contractions, leaking of fluid and fetal movement were reviewed in  detail with the patient. Please refer to After Visit Summary for other counseling recommendations.  No follow-ups on file.  Future Appointments  Date Time Provider [place]7/14/2019  7:30 AM [facility]-B[facility]ED ROOM [facility] None  11/28/2017  8:30 AM [facility] NURSE [facility] None    JaTruett MainlandDO

## 2017-11-25 ENCOUNTER — Inpatient Hospital Stay (HOSPITAL_COMMUNITY)
Admission: AD | Admit: 2017-11-25 | Discharge: 2017-11-25 | Disposition: A | Discharge status: Home / Self Care | Payer: Managed Care, Other (non HMO) | Source: Ambulatory Visit | Attending: Obstetrics & Gynecology | Admitting: Obstetrics & Gynecology

## 2017-11-25 DIAGNOSIS — O3440 Maternal care for other abnormalities of cervix, unspecified trimester: Secondary | ICD-10-CM

## 2017-11-25 DIAGNOSIS — Z3689 Encounter for other specified antenatal screening: Secondary | ICD-10-CM

## 2017-11-25 DIAGNOSIS — O359XX0 Maternal care for (suspected) fetal abnormality and damage, unspecified, not applicable or unspecified: Secondary | ICD-10-CM | POA: Diagnosis not present

## 2017-11-25 DIAGNOSIS — Z3A40 40 weeks gestation of pregnancy: Secondary | ICD-10-CM

## 2017-11-25 NOTE — Discharge Instructions (Signed)
Braxton Hicks Contractions °Contractions of the uterus can occur throughout pregnancy, but they are not always a sign that you are in labor. You may have practice contractions called Braxton Hicks contractions. These false labor contractions are sometimes confused with true labor. °What are Braxton Hicks contractions? °Braxton Hicks contractions are tightening movements that occur in the muscles of the uterus before labor. Unlike true labor contractions, these contractions do not result in opening (dilation) and thinning of the cervix. Toward the end of pregnancy (32-34 weeks), Braxton Hicks contractions can happen more often and may become stronger. These contractions are sometimes difficult to tell apart from true labor because they can be very uncomfortable. You should not feel embarrassed if you go to the hospital with false labor. °Sometimes, the only way to tell if you are in true labor is for your health care provider to look for changes in the cervix. The health care provider will do a physical exam and may monitor your contractions. If you are not in true labor, the exam should show that your cervix is not dilating and your water has not broken. °If there are other health problems associated with your pregnancy, it is completely safe for you to be sent home with false labor. You may continue to have Braxton Hicks contractions until you go into true labor. °How to tell the difference between true labor and false labor °True labor °· Contractions last 30-70 seconds. °· Contractions become very regular. °· Discomfort is usually felt in the top of the uterus, and it spreads to the lower abdomen and low back. °· Contractions do not go away with walking. °· Contractions usually become more intense and increase in frequency. °· The cervix dilates and gets thinner. °False labor °· Contractions are usually shorter and not as strong as true labor contractions. °· Contractions are usually irregular. °· Contractions  are often felt in the front of the lower abdomen and in the groin. °· Contractions may go away when you walk around or change positions while lying down. °· Contractions get weaker and are shorter-lasting as time goes on. °· The cervix usually does not dilate or become thin. °Follow these instructions at home: °· Take over-the-counter and prescription medicines only as told by your health care provider. °· Keep up with your usual exercises and follow other instructions from your health care provider. °· Eat and drink lightly if you think you are going into labor. °· If Braxton Hicks contractions are making you uncomfortable: °? Change your position from lying down or resting to walking, or change from walking to resting. °? Sit and rest in a tub of warm water. °? Drink enough fluid to keep your urine pale yellow. Dehydration may cause these contractions. °? Do slow and deep breathing several times an hour. °· Keep all follow-up prenatal visits as told by your health care provider. This is important. °Contact a health care provider if: °· You have a fever. °· You have continuous pain in your abdomen. °Get help right away if: °· Your contractions become stronger, more regular, and closer together. °· You have fluid leaking or gushing from your vagina. °· You pass blood-tinged mucus (bloody show). °· You have bleeding from your vagina. °· You have low back pain that you never had before. °· You feel your baby’s head pushing down and causing pelvic pressure. °· Your baby is not moving inside you as much as it used to. °Summary °· Contractions that occur before labor are called Braxton   Hicks contractions, false labor, or practice contractions. °· Braxton Hicks contractions are usually shorter, weaker, farther apart, and less regular than true labor contractions. True labor contractions usually become progressively stronger and regular and they become more frequent. °· Manage discomfort from Braxton Hicks contractions by  changing position, resting in a warm bath, drinking plenty of water, or practicing deep breathing. °This information is not intended to replace advice given to you by your health care provider. Make sure you discuss any questions you have with your health care provider. °Document Released: 09/15/2016 Document Revised: 09/15/2016 Document Reviewed: 09/15/2016 °Elsevier Interactive Patient Education © 2018 [employer] ° °

## 2017-11-25 NOTE — MAU Provider Note (Signed)
HPI Erica Shelton is a 32 y.o. female  G1P0 @40 .1 wks here for outpatient foley placement for cervical ripening. Pt reports no contractions. She denies VB or LOF. She reports good fetal movement. All other systems negative.    Patient's last menstrual period was 02/17/2017 (exact date).  OB History  Gravida Para Term Preterm AB Living  1            SAB TAB Ectopic Multiple Live Births               # Outcome Date GA Lbr Len/2nd Weight Sex Delivery Anes PTL Lv  1 Current             Past Medical History:  Diagnosis Date  . Medical history non-contributory     Family History  Adopted: Yes  Problem Relation Age of Onset  . Cancer Neg Hx     Social History   Socioeconomic History  . Marital status: Single    Spouse name: Not on file  . Number of children: Not on file  . Years of education: Not on file  . Highest education level: Not on file  Occupational History  . Not on file  Social Needs  . Financial resource strain: Not on file  . Food insecurity:    Worry: Not on file    Inability: Not on file  . Transportation needs:    Medical: Not on file    Non-medical: Not on file  Tobacco Use  . Smoking status: Never Smoker  . Smokeless tobacco: Never Used  Substance and Sexual Activity  . Alcohol use: No    Frequency: Never  . Drug use: No  . Sexual activity: Yes  Lifestyle  . Physical activity:    Days per week: Not on file    Minutes per session: Not on file  . Stress: Not on file  Relationships  . Social connections:    Talks on phone: Not on file    Gets together: Not on file    Attends religious service: Not on file    Active member of club or organization: Not on file    Attends meetings of clubs or organizations: Not on file    Relationship status: Not on file  Other Topics Concern  . Not on file  Social History Narrative  . Not on file    No Known Allergies  No current facility-administered medications on file prior to encounter.    Current  Outpatient Medications on File Prior to Encounter  Medication Sig Dispense Refill  . Misc. Devices KIT One Medella  breast pump for supplementation of breastfeeding. Patient due date 11-24-17 1 each 0  . Prenatal Vit-Fe Fumarate-FA (PRENATAL VITAMINS PLUS PO) Take by mouth.      Review of Systems  Gastrointestinal: Negative for abdominal pain.  Genitourinary: Negative for vaginal bleeding and vaginal discharge.    Physical Exam   Vitals:   11/25/17 1545 11/25/17 1549  BP:  123/79  Pulse:  78  Resp:  18  Temp:  98.4 F (36.9 C)  TempSrc:  Oral  SpO2:  100%  Weight: 226 lb 0.6 oz (102.5 kg)     Physical Exam  Constitutional: She is oriented to person, place, and time. She appears well-developed and well-nourished. No distress.  HENT:  Head: Normocephalic and atraumatic.  Neck: Normal range of motion.  Cardiovascular: Normal rate.  Respiratory: Effort normal. No respiratory distress.  Musculoskeletal: Normal range of motion.  Neurological: She is alert  and oriented to person, place, and time.  Skin: Skin is warm and dry.  Psychiatric: She has a normal mood and affect.     MAU Course   Procedure: Patient informed of R/B/A of procedure. NST was performed and was reactive prior to procedure. Procedure done to begin ripening of the cervix prior to admission for induction of labor. Appropriate time out taken. The patient was placed in the lithotomy position and a cervical exam was performed and a finger was used to guide the 14F foley balloon through the internal os of the cervix. Foley Balloon filled with 60cc of normal saline. Plug inserted into end of the foley. Foley placed on tension and taped to medical thigh. NST:  Baseline: 135 bpm, Variability: Good {> 6 bpm), Accelerations: Reactive and Decelerations: Absent there were no signs of tachysystole or hypertonus. All equipment was removed and accounted for. The patient tolerated the procedure well.   Assessment and Plan:    1. [redacted] weeks gestation of pregnancy   2. NST (non-stress test) reactive   3. Unfavorable cervix in term pregnancy    S/p Outpatient placement of foley balloon catheter for cervical ripening. Induction of labor scheduled for tomorrow at 0730 am. Reassuring FHR tracing with no concerns at present. Warning signs given to patient to include return to MAU for heavy vaginal bleeding, rupture of membranes, painful uterine contractions q 5 mins or less, severe abdominal discomfort, decreased fetal movement.   Julianne Handler, CNM 11/25/2017 5:28 PM

## 2017-11-25 NOTE — MAU Note (Addendum)
Patient here for foley bulb. Denies any complaints at this time.  Scheduled for induction tomorrow 730am  Vitals:   11/25/17 1549  BP: 123/79  Pulse: 78  Resp: 18  Temp: 98.4 F (36.9 C)  SpO2: 100%

## 2017-11-26 ENCOUNTER — Encounter (HOSPITAL_COMMUNITY): Payer: Self-pay

## 2017-11-26 ENCOUNTER — Inpatient Hospital Stay (HOSPITAL_COMMUNITY)
Admission: RE | Admit: 2017-11-26 | Discharge: 2017-11-28 | DRG: 807 | Disposition: A | Discharge status: Home / Self Care | Payer: Managed Care, Other (non HMO) | Source: Ambulatory Visit | Attending: Family Medicine | Admitting: Family Medicine

## 2017-11-26 ENCOUNTER — Other Ambulatory Visit: Payer: Self-pay

## 2017-11-26 DIAGNOSIS — O359XX0 Maternal care for (suspected) fetal abnormality and damage, unspecified, not applicable or unspecified: Principal | ICD-10-CM | POA: Diagnosis present

## 2017-11-26 DIAGNOSIS — O480 Post-term pregnancy: Secondary | ICD-10-CM

## 2017-11-26 DIAGNOSIS — O0990 Supervision of high risk pregnancy, unspecified, unspecified trimester: Secondary | ICD-10-CM

## 2017-11-26 DIAGNOSIS — Z3A40 40 weeks gestation of pregnancy: Secondary | ICD-10-CM

## 2017-11-26 LAB — CBC
HEMATOCRIT: 35.7 % — AB (ref 36.0–46.0)
HEMOGLOBIN: 11.8 g/dL — AB (ref 12.0–15.0)
MCH: 30.5 pg (ref 26.0–34.0)
MCHC: 33.1 g/dL (ref 30.0–36.0)
MCV: 92.2 fL (ref 78.0–100.0)
Platelets: 283 10*3/uL (ref 150–400)
RBC: 3.87 MIL/uL (ref 3.87–5.11)
RDW: 13.5 % (ref 11.5–15.5)
WBC: 14.5 10*3/uL — AB (ref 4.0–10.5)

## 2017-11-26 LAB — TYPE AND SCREEN
ABO/RH(D): B POS
ANTIBODY SCREEN: NEGATIVE

## 2017-11-26 LAB — RPR: RPR Ser Ql: NONREACTIVE

## 2017-11-26 LAB — ABO/RH: ABO/RH(D): B POS

## 2017-11-26 MED ORDER — COCONUT OIL OIL
1.0000 "application " | TOPICAL_OIL | Status: DC | PRN
  Administered 2017-11-27: 1 via TOPICAL
  Filled 2017-11-26: qty 120

## 2017-11-26 MED ORDER — OXYCODONE-ACETAMINOPHEN 5-325 MG PO TABS: 1.0000 | ORAL_TABLET | ORAL | Status: DC | PRN

## 2017-11-26 MED ORDER — ONDANSETRON HCL 4 MG/2ML IJ SOLN
4.0000 mg | Freq: Four times a day (QID) | INTRAMUSCULAR | Status: DC | PRN
  Administered 2017-11-26: 4 mg via INTRAVENOUS
  Filled 2017-11-26: qty 2

## 2017-11-26 MED ORDER — PHENYLEPHRINE 40 MCG/ML (10ML) SYRINGE FOR IV PUSH (FOR BLOOD PRESSURE SUPPORT)
80.0000 ug | PREFILLED_SYRINGE | INTRAVENOUS | Status: DC | PRN
  Filled 2017-11-26: qty 5

## 2017-11-26 MED ORDER — ZOLPIDEM TARTRATE 5 MG PO TABS
5.0000 mg | ORAL_TABLET | Freq: Every evening | ORAL | Status: DC | PRN
Start: 2017-11-26 — End: 2017-11-28

## 2017-11-26 MED ORDER — TETANUS-DIPHTH-ACELL PERTUSSIS 5-2.5-18.5 LF-MCG/0.5 IM SUSP: 0.5000 mL | Freq: Once | INTRAMUSCULAR | Status: DC

## 2017-11-26 MED ORDER — ACETAMINOPHEN 325 MG PO TABS: 650.0000 mg | ORAL_TABLET | ORAL | Status: DC | PRN

## 2017-11-26 MED ORDER — DIPHENHYDRAMINE HCL 25 MG PO CAPS: 25.0000 mg | ORAL_CAPSULE | Freq: Four times a day (QID) | ORAL | Status: DC | PRN

## 2017-11-26 MED ORDER — EPHEDRINE 5 MG/ML INJ
10.0000 mg | INTRAVENOUS | Status: DC | PRN
  Filled 2017-11-26: qty 2

## 2017-11-26 MED ORDER — OXYTOCIN BOLUS FROM INFUSION
500.0000 mL | Freq: Once | INTRAVENOUS | Status: AC
  Administered 2017-11-26: 500 mL via INTRAVENOUS

## 2017-11-26 MED ORDER — ACETAMINOPHEN 325 MG PO TABS
650.0000 mg | ORAL_TABLET | ORAL | Status: DC | PRN
  Filled 2017-11-26: qty 2

## 2017-11-26 MED ORDER — WITCH HAZEL-GLYCERIN EX PADS: 1.0000 "application " | MEDICATED_PAD | CUTANEOUS | Status: DC | PRN

## 2017-11-26 MED ORDER — LACTATED RINGERS IV SOLN
INTRAVENOUS | Status: DC
  Administered 2017-11-26: 07:00:00 via INTRAVENOUS

## 2017-11-26 MED ORDER — IBUPROFEN 600 MG PO TABS
600.0000 mg | ORAL_TABLET | Freq: Four times a day (QID) | ORAL | Status: DC
  Administered 2017-11-26 – 2017-11-28 (×8): 600 mg via ORAL
  Filled 2017-11-26 (×8): qty 1

## 2017-11-26 MED ORDER — DIPHENHYDRAMINE HCL 50 MG/ML IJ SOLN
12.5000 mg | INTRAMUSCULAR | Status: DC | PRN
Start: 2017-11-26 — End: 2017-11-26

## 2017-11-26 MED ORDER — TERBUTALINE SULFATE 1 MG/ML IJ SOLN
0.2500 mg | Freq: Once | INTRAMUSCULAR | Status: DC | PRN
  Filled 2017-11-26: qty 1

## 2017-11-26 MED ORDER — FENTANYL CITRATE (PF) 100 MCG/2ML IJ SOLN
INTRAMUSCULAR | Status: AC
  Filled 2017-11-26: qty 2

## 2017-11-26 MED ORDER — OXYCODONE-ACETAMINOPHEN 5-325 MG PO TABS: 2.0000 | ORAL_TABLET | ORAL | Status: DC | PRN

## 2017-11-26 MED ORDER — ONDANSETRON HCL 4 MG/2ML IJ SOLN: 4.0000 mg | INTRAMUSCULAR | Status: DC | PRN

## 2017-11-26 MED ORDER — ONDANSETRON HCL 4 MG PO TABS: 4.0000 mg | ORAL_TABLET | ORAL | Status: DC | PRN

## 2017-11-26 MED ORDER — FENTANYL CITRATE (PF) 100 MCG/2ML IJ SOLN
100.0000 ug | INTRAMUSCULAR | Status: DC | PRN
  Administered 2017-11-26 (×3): 100 ug via INTRAVENOUS
  Filled 2017-11-26 (×2): qty 2

## 2017-11-26 MED ORDER — FENTANYL 2.5 MCG/ML BUPIVACAINE 1/10 % EPIDURAL INFUSION (WH - ANES): 14.0000 mL/h | INTRAMUSCULAR | Status: DC | PRN

## 2017-11-26 MED ORDER — BENZOCAINE-MENTHOL 20-0.5 % EX AERO
1.0000 "application " | INHALATION_SPRAY | CUTANEOUS | Status: DC | PRN
  Administered 2017-11-27: 1 via TOPICAL
  Filled 2017-11-26: qty 56

## 2017-11-26 MED ORDER — SOD CITRATE-CITRIC ACID 500-334 MG/5ML PO SOLN
30.0000 mL | ORAL | Status: DC | PRN
Start: 2017-11-26 — End: 2017-11-26

## 2017-11-26 MED ORDER — SIMETHICONE 80 MG PO CHEW: 80.0000 mg | CHEWABLE_TABLET | ORAL | Status: DC | PRN

## 2017-11-26 MED ORDER — SENNOSIDES-DOCUSATE SODIUM 8.6-50 MG PO TABS
2.0000 | ORAL_TABLET | ORAL | Status: DC
  Administered 2017-11-27 (×2): 2 via ORAL
  Filled 2017-11-26 (×2): qty 2

## 2017-11-26 MED ORDER — LACTATED RINGERS IV SOLN: 500.0000 mL | Freq: Once | INTRAVENOUS | Status: DC

## 2017-11-26 MED ORDER — LIDOCAINE HCL (PF) 1 % IJ SOLN
30.0000 mL | INTRAMUSCULAR | Status: DC | PRN
  Administered 2017-11-26: 30 mL via SUBCUTANEOUS
  Filled 2017-11-26: qty 30

## 2017-11-26 MED ORDER — OXYTOCIN 40 UNITS IN LACTATED RINGERS INFUSION - SIMPLE MED
1.0000 m[IU]/min | INTRAVENOUS | Status: DC
  Administered 2017-11-26: 2 m[IU]/min via INTRAVENOUS
  Administered 2017-11-26: 6 m[IU]/min via INTRAVENOUS
  Administered 2017-11-26: 4 m[IU]/min via INTRAVENOUS
  Filled 2017-11-26: qty 1000

## 2017-11-26 MED ORDER — DIBUCAINE 1 % RE OINT: 1.0000 "application " | TOPICAL_OINTMENT | RECTAL | Status: DC | PRN

## 2017-11-26 MED ORDER — PRENATAL MULTIVITAMIN CH
1.0000 | ORAL_TABLET | Freq: Every day | ORAL | Status: DC
  Administered 2017-11-27 – 2017-11-28 (×2): 1 via ORAL
  Filled 2017-11-26 (×2): qty 1

## 2017-11-26 MED ORDER — LACTATED RINGERS IV SOLN: 500.0000 mL | INTRAVENOUS | Status: DC | PRN

## 2017-11-26 MED ORDER — OXYTOCIN 40 UNITS IN LACTATED RINGERS INFUSION - SIMPLE MED: 2.5000 [IU]/h | INTRAVENOUS | Status: DC

## 2017-11-26 NOTE — H&P (Signed)
Rabia Argote is a 32 y.o. female presenting for IOL for fetal anomalies.. OB History    Gravida  1   Para      Term      Preterm      AB      Living        SAB      TAB      Ectopic      Multiple      Live Births             Past Medical History:  Diagnosis Date  . Medical history non-contributory    Past Surgical History:  Procedure Laterality Date  . NO PAST SURGERIES     Family History: family history is not on file. She was adopted. Social History:  reports that she has never smoked. She has never used smokeless tobacco. She reports that she does not drink alcohol or use drugs.     Maternal Diabetes: No Genetic Screening: Normal Maternal Ultrasounds/Referrals: Abnormal:  Findings:   Other: cavum vergae Fetal Ultrasounds or other Referrals:  Referred to Materal Fetal Medicine  Maternal Substance Abuse:  No Significant Maternal Medications:  None Significant Maternal Lab Results:  None Other Comments:  None  Review of Systems  Constitutional: Negative.   HENT: Negative.   Eyes: Negative.   Respiratory: Negative.   Cardiovascular: Negative.   Gastrointestinal: Negative.   Genitourinary: Negative.   Musculoskeletal: Negative.   Skin: Negative.   Neurological: Negative.   Endo/Heme/Allergies: Negative.   Psychiatric/Behavioral: Negative.    Maternal Medical History:  Reason for admission: IOL  Contractions: none  Fetal activity: Perceived fetal activity is normal.   Last perceived fetal movement was within the past hour.    Prenatal complications: Fetal anomalies  Prenatal Complications - Diabetes: none.    Dilation: 4 Effacement (%): 70 Station: -2 Exam by:: k fields, rn Blood pressure 124/77, pulse 73, temperature 97.8 F (36.6 C), temperature source Oral, resp. rate 18, height 5\' 7"  (1.702 m), weight 223 lb 6.4 oz (101.3 kg), last menstrual period 02/17/2017. Maternal Exam:  Abdomen: Patient reports no abdominal tenderness.  Fetal presentation: vertex  Introitus: Normal vulva. Normal vagina.  Ferning test: not done.  Nitrazine test: not done.  Pelvis: adequate for delivery.      Fetal Exam Fetal Monitor Review: Mode: ultrasound.   Baseline rate: 145.  Variability: moderate (6-25 bpm).   Pattern: accelerations present.    Fetal State Assessment: Category I - tracings are normal.     Physical Exam  Constitutional: She is oriented to person, place, and time. She appears well-developed and well-nourished.  HENT:  Head: Normocephalic.  Eyes: Pupils are equal, round, and reactive to light.  Neck: Normal range of motion.  Cardiovascular: Normal rate, regular rhythm, normal heart sounds and intact distal pulses.  Respiratory: Effort normal and breath sounds normal.  GI: Soft. Bowel sounds are normal.  Musculoskeletal: Normal range of motion.  Neurological: She is alert and oriented to person, place, and time. She has normal reflexes.  Skin: Skin is warm and dry.  Psychiatric: She has a normal mood and affect. Her behavior is normal. Judgment and thought content normal.    Prenatal labs: ABO, Rh: --/--/B POS (07/14 9562) Antibody: PENDING (07/14 0742) Rubella: 0.98 (01/07 0916) RPR: Non Reactive (04/15 1109)  HBsAg: Negative (01/07 0916)  HIV: Non Reactive (04/15 1109)  GBS: Negative (06/12 0000)   Assessment/Plan: IOL for fetal anomalies,  SVE 4/th/post/vertex Pitocin induction of labor.  Wyvonnia DuskyMarie Lawson 11/26/2017, 9:06 AM

## 2017-11-26 NOTE — Progress Notes (Signed)
Patient ID: Erica Shelton, female   DOB: [birth year], 32 y.o.   MRN: [id number]  Labor Progress Note Erica Shelton is a 32 y.o. G1P0 at 5338w2d presented for IOL for fetal anomaly  S: introduced self to patient.  Patient did not have any complaints or questions at this time.   O:  BP (!) 108/50   Pulse 66   Temp 98.6 F (37 C) (Oral)   Resp 18   Ht 5\' 7"  (1.702 m)   Wt 101.3 kg (223 lb 6.4 oz)   LMP 02/17/2017 (Exact Date)   BMI 34.99 kg/m  EFM: 145/moderate/present accels  CVE: Dilation: 4 Effacement (%): 70 Cervical Position: Posterior Station: -2 Presentation: Vertex Exam by:: k fields, rn   A&P: 32 y.o. G1P0 6538w2d IOL for fetal anomaly.  #Labor: FB out. On pit. Membranes intact.  #Pain: no #FWB: cat I #GBS negative   Sandre Kittyaniel K Dajon Rowe, MD 11:44 AM

## 2017-11-26 NOTE — Anesthesia Pain Management Evaluation Note (Signed)
  CRNA Pain Management Visit Note  Patient: Erica Shelton, 32 y.o., female  "Hello I am a [facility] of the [facility]. We have an anesthesia team available at all times to provide care throughout the hospital, including epidural management and anesthesia for C-section. I don't know your plan for the delivery whether it a natural birth, water birth, IV sedation, nitrous supplementation, doula or epidural, but we want to meet your pain goals."   1.Was your pain managed to your expectations on prior hospitalizations?   No prior hospitalizations  2.What is your expectation for pain management during this hospitalization?     Labor support without medications  3.How can we help you reach that goal? Standby.  Record the patient's initial score and the patient's pain goal.   Pain: 3  Pain Goal: 10 [facility] wants you to be able to say your pain was always managed very well.  Erica Shelton 11/26/2017

## 2017-11-27 NOTE — Lactation Note (Signed)
This note was copied from a baby's chart. Lactation Consultation Note  Patient Name: Erica Shelton ZOXWR'UToday's Date: 11/27/2017 Reason for consult: Follow-up assessment;1st time breastfeeding 29 hrs, P1 LC entered room Mom holding baby per mom, finish feeding baby in football hold for 15-20 minutes. Breastfeeding is going well LC helped her earlier. Taught back( knowledgeable) of  STS, breast massage and Breast compressions when latching infant to breast.   Maternal Data    Feeding Feeding Type: Breast Fed Length of feed: 15 min  LATCH Score Latch: Grasps breast easily, tongue down, lips flanged, rhythmical sucking.  Audible Swallowing: Spontaneous and intermittent  Type of Nipple: Everted at rest and after stimulation  Comfort (Breast/Nipple): Filling, red/small blisters or bruises, mild/mod discomfort  Hold (Positioning): No assistance needed to correctly position infant at breast.  LATCH Score: 9  Interventions    Lactation Tools Discussed/Used     Consult Status Consult Status: Follow-up Date: 11/28/17 Follow-up type: In-patient    Danelle EarthlyRobin Inmer Nix 11/27/2017, 10:41 PM

## 2017-11-27 NOTE — Progress Notes (Signed)
POSTPARTUM PROGRESS NOTE  Post Partum Day 1   Subjective:  Erica Shelton is a 32 y.o. G1P1001 1478w2d s/p ppd1 svd, labor augmented with pitocin (preceeded by foley placement for cervical ripening on 13 July).  No acute events overnight.  Pt denies problems with ambulating, voiding or po intake.  She denies nausea or vomiting.  Pain is well controlled. Lochia Moderate. Pt denies clots, but reports heavy blood flow where she is noticing that her her pads are saturated when she goes to the bathroom (approximately every 3 hours).    Pt also reports no trouble with baby latching but is interested in a visit from the lactation nurse.     Objective: Blood pressure 103/65, pulse 82, temperature 98.1 F (36.7 C), temperature source Oral, resp. rate 18, height 5\' 7"  (1.702 m), weight 101.3 kg (223 lb 6.4 oz), last menstrual period 02/17/2017, SpO2 98 %,    Physical Exam:  General: alert, cooperative and no distress Lochia:normal flow Pulm: no respiratory distress, ctab Heart:regular rate,  no m/r/g auscultated Abdomen: soft, nontender,  Uterine Fundus: firm, appropriately tender (observed while completed by Dr. Doroteo GlassmanPhelps) DVT Evaluation: No calf swelling or tenderness Extremities: no edema  1st Degree Laceration Recent Labs    11/26/17 0742  HGB 11.8*  HCT 35.7*    Assessment/Plan:  ASSESSMENT: Erica Shelton, a 32 y.o. G1P1001 6078w2d s/p ppd1 svb, is afebrile with vitals within normal limits, and is recovering well.   Plan for discharge tomorrow; 1.  Lactation consult as pt is planning on breasfeeding (pumping milk and feeding baby through bottle) 2. Circumcision of baby in hospital 3. Pt endeavors to use POP for birth control    LOS: 1 day   Ronnie DossChika I Dorothy Landgrebe MS3 11/27/2017, 8:59 AM

## 2017-11-27 NOTE — Lactation Note (Signed)
This note was copied from a baby's chart. Lactation Consultation Note  Patient Name: Erica Shelton NWGNF'AToday's Date: 11/27/2017 Reason for consult: Initial assessment;Primapara;1st time breastfeeding;Nipple pain/trauma;Term  Baby is 9320 hours old  LC reviewed and updated the doc flow sheets per dad.  LC reviewed hand expressing - mom did well returning demo / with EBM yield  Baby awake and hungry, baby placed STS  On the left breast / football/ and worked on depth  Due to mom saying she is sore at 20 hours PP . LC noted some areola edema. Baby fed 15 mins With multiple swallows, increased with breast compressions. Baby released at 15 mins, spit up a small  Amount of colostrum, and then burped, no bulb syringe needed. Per mom comfortable with the entire feeding.  LC reviewed breast feeding basics with mom and dad, instructed mom on the use shells between feedings  Except when sleeping, and hand pump when for D/C .  Per mom will have a DEBP Medela at home.  Mom and dad asked questions when to introduce a bottle to get ready for returning back to work at 12 weeks.  LC recommended around 4-6 weeks 1 bottle a day.  Mother informed of post-discharge support and given phone number to the lactation department, including services for phone call assistance; out-patient appointments; and breastfeeding support group. List of other breastfeeding resources in the community given in the handout. Encouraged mother to call for problems or concerns related to breastfeeding.    Maternal Data Has patient been taught Hand Expression?: Yes(LC instructed mom and mom returned demo well with EBM yield )  Feeding Feeding Type: Breast Fed Length of feed: 15 min(multiple swallows/ increased w / breast compressions )  LATCH Score Latch: Grasps breast easily, tongue down, lips flanged, rhythmical sucking.(worked on depth at the breast and firm support )  Audible Swallowing: Spontaneous and intermittent  Type of  Nipple: Everted at rest and after stimulation  Comfort (Breast/Nipple): Filling, red/small blisters or bruises, mild/mod discomfort(per mom nipples are sore , no breakdown noted )  Hold (Positioning): Assistance needed to correctly position infant at breast and maintain latch.  LATCH Score: 8  Interventions Interventions: Breast feeding basics reviewed;Assisted with latch;Skin to skin;Breast massage;Hand express;Reverse pressure;Breast compression;Adjust position;Support pillows;Position options;Shells;Coconut oil;Hand pump  Lactation Tools Discussed/Used Tools: Shells;Pump;Coconut oil(shells due to sore nipples and swollen areolas ) Shell Type: Inverted Breast pump type: Manual WIC Program: No Pump Review: Setup, frequency, and cleaning Initiated by:: MAI  Date initiated:: 11/27/17   Consult Status Consult Status: Follow-up Date: 11/28/17 Follow-up type: In-patient    Erica Shelton 11/27/2017, 3:16 PM

## 2017-11-28 ENCOUNTER — Other Ambulatory Visit: Payer: Managed Care, Other (non HMO)

## 2017-11-28 MED ORDER — IBUPROFEN 600 MG PO TABS: 600.0000 mg | ORAL_TABLET | Freq: Four times a day (QID) | ORAL | 0 refills | Status: DC

## 2017-11-28 NOTE — Lactation Note (Signed)
This note was copied from a baby's chart. Lactation Consultation Note Baby 33 hrs old, mom having difficulty73 latching d/t baby fussy. Mom stated baby has BF 15 min. Still cueing and screaming. Acting hungry but will not latch. Mom's breast are filling, nipple short shaft, baby unable to obtain deep latch frustrating baby. Fitted mom w/#16 NS for small nipples. Calmed baby down, latched w/o difficulty. Heard audible swallows.  Mom demonstrated application of NS. LC adjusted placement of NS. Noted NS full of transitional milk. Encouraged mom to occasionally massage breast during feeding. Discussed support, props, and comfort during feeding. Strongly encouraged mom to wear shells and pre-pump to wean off of NS to obtain deep latch.  Newborn behavior and feeding habits reviewed. Praised mom for milk supply.  Patient Name: Erica Shelton Reason for consult: Mother's request;Difficult latch   Maternal Data    Feeding Feeding Type: Breast Fed Length of feed: 18 min(still BF)  LATCH Score Latch: Repeated attempts needed to sustain latch, nipple held in mouth throughout feeding, stimulation needed to elicit sucking reflex.  Audible Swallowing: Spontaneous and intermittent  Type of Nipple: Everted at rest and after stimulation(short shaft)  Comfort (Breast/Nipple): Soft / non-tender  Hold (Positioning): Assistance needed to correctly position infant at breast and maintain latch.  LATCH Score: 8  Interventions Interventions: Breast feeding basics reviewed;Support pillows;Assisted with latch;Position options;Skin to skin;Breast massage;Coconut oil;Hand express;Shells;Pre-pump if needed;Hand pump;Breast compression;Adjust position  Lactation Tools Discussed/Used Tools: Shells;Pump;Coconut oil;Nipple Shields Nipple shield size: 16;20 Shell Type: Inverted Breast pump type: Manual   Consult Status Consult Status: Follow-up Date: 11/28/17 Follow-up type:  In-patient    Erica Shelton, [name] Shelton, 4:20 AM

## 2017-11-28 NOTE — Lactation Note (Signed)
This note was copied from a baby's chart. Lactation Consultation Note  Patient Name: Boy Willia CrazeKelly Gladulich ZOXWR'UToday's Date: 11/28/2017 Reason for consult: Follow-up assessment;Infant weight loss;Term;1st time breastfeeding;Primapara(5% weight loss )  Baby is 7639 hours old  Per mom baby recently had breast fed at 0830 with #16 NS, and mom reports milk in the NS and comfort with feeding.  LC reviewed LC Plan with use of Nipple Shield, and the importance of working on weaning the baby off the Con-wayipple Shield.  LC recommended latching baby prior to baby getting to fussy and hungry, or feed the 1st breast with the NS, ( EBM appetizer Instilled in the top of the NS ) and then on the 2nd breast/ try latching without the NS.  Discussed the need for extra post pumping after 5 feedings a day for 10 -15 mins both breast together to protect establishing  Milk supply.  Both nipples appear healthier than yesterday, and mom did not C/O soreness to day. LC noted increased areola edema now that milk is coming in. Breast are warm, few nodules noted laterally.  Sore nipple and engorgement prevention and tx reviewed. LC encouraged mom to use the shells between feedings except when sleeping until the soreness is better, and the comfort gels after feedings or pumping.  Mom was given breast shells yesterday , and just put them on this am, and reported how comfortable they were.  Discussed nutritive vs non - nutritive feeding patterns and the importance of watching for hanging out latched.  LC [facility] And mom declined today and will call back if she is still having to use the NS for latching.  LC stressed the importance of LC F/U if soreness isn't improving by the 4 day. And if needing to use NS to latch.  Mother informed of post-discharge support and given phone number to the lactation department, including services for phone call assistance; out-patient appointments; and breastfeeding support  group. List of other breastfeeding resources in the community given in the handout. Encouraged mother to call for problems or concerns related to breastfeeding.   Maternal Data Has patient been taught Hand Expression?: Yes  Feeding Feeding Type: (per mom baby recently breast fed at 0830, per mom milk in NS) Length of feed: (feeding was cmfortable with #16 NS )  LATCH Score                   Interventions Interventions: Breast feeding basics reviewed  Lactation Tools Discussed/Used Tools: Shells;Pump;Coconut oil;Comfort gels;Nipple Shields Nipple shield size: 16;20;Other (comment)(LC resized NS, #16 boarder line fit/ #20 NS slightly loose ) Shell Type: Sore Breast pump type: Manual;Other (comment)(per mom has DEBP at home )   Consult Status Consult Status: Complete Date: (LC offered to request and LC O/P appt/ mom preferred to call) Follow-up type: Out-patient    Matilde SprangMargaret Ann Charle Mclaurin 11/28/2017, 9:39 AM

## 2017-11-28 NOTE — Discharge Summary (Signed)
OB Discharge Summary     Patient Name: Erica Shelton DOB: [birth year] MRN: [id number]  Date of admission: 11/26/2017 Delivering MD: Gailen Shelter   Date of discharge: 11/28/2017  Admitting diagnosis: 40wks, induction Intrauterine pregnancy: [redacted]w[redacted]d    Secondary diagnosis:  Active Problems:   High-risk pregnancy   SVD (spontaneous vaginal delivery)  Additional problems: fetal anomaly (cavum vergae, renal)     Discharge diagnosis: Term Pregnancy Delivered                                                                                                Post partum procedures:none  Augmentation: AROM, Pitocin and Foley Balloon  Complications: None  Hospital course:  Induction of Labor With Vaginal Delivery   32y.o. yo G1P1001 at 465w2das admitted to the hospital 11/26/2017 for induction of labor.  Indication for induction: fetal anomaly.  Patient had an uncomplicated labor course as follows: Membrane Rupture Time/Date: 5:37 PM ,11/26/2017   Intrapartum Procedures: Episiotomy: None [1]                                         Lacerations:  1st degree [2];Perineal [11];Labial [10]  Patient had delivery of a Viable infant.  Information for the patient's newborn:  GlTationa, Stech0[[id number]]Delivery Method: Vaginal, Spontaneous(Filed from Delivery Summary)   11/26/2017  Details of delivery can be found in separate delivery note.  Patient had a routine postpartum course. Patient is discharged home 11/28/17.  Physical exam  Vitals:   11/27/17 0526 11/27/17 1500 11/27/17 2235 11/28/17 0628  BP: 103/65 106/64 104/62 102/70  Pulse: 82 76 68 71  Resp: 18 16 18 20   Temp: 98.1 F (36.7 C) 98.1 F (36.7 C) 98 F (36.7 C) 97.8 F (36.6 C)  TempSrc: Oral Oral Oral Oral  SpO2: 98%     Weight:      Height:       General: alert, cooperative and no distress Lochia: appropriate Uterine Fundus: firm Incision: N/A DVT Evaluation: No evidence of DVT seen on physical  exam. Labs: Lab Results  Component Value Date   WBC 14.5 (H) 11/26/2017   HGB 11.8 (L) 11/26/2017   HCT 35.7 (L) 11/26/2017   MCV 92.2 11/26/2017   PLT 283 11/26/2017   No flowsheet data found.  Discharge instruction: per After Visit Summary and "Baby and Me Booklet".  After visit meds:  Allergies as of 11/28/2017   No Known Allergies     Medication List    STOP taking these medications   Misc. Devices Kit   PRENATAL VITAMINS PLUS PO     TAKE these medications   ibuprofen 600 MG tablet Commonly known as:  ADVIL,MOTRIN Take 1 tablet (600 mg total) by mouth every 6 (six) hours.       Diet: routine diet  Activity: Advance as tolerated. Pelvic rest for 6 weeks.   Outpatient follow up:6 weeks Follow up Appt: Future Appointments  Date Time Provider [place]  12/29/2017  9:30 AM Truett Mainland, DO [facility] None   Follow up Visit:No follow-ups on file.  Postpartum contraception: Progesterone only pills  Newborn Data: Live born female  Birth Weight: 7 lb 13.9 oz (3570 g) APGAR: 8, 9  Newborn Delivery   Birth date/time:  11/26/2017 18:28:00 Delivery type:  Vaginal, Spontaneous     Baby Feeding: Breast Disposition:home with mother   11/28/2017 Benay Pike, MD

## 2017-11-28 NOTE — Discharge Instructions (Signed)
Vaginal Delivery, Care After °Refer to this sheet in the next few weeks. These instructions provide you with information about caring for yourself after vaginal delivery. Your health care provider may also give you more specific instructions. Your treatment has been planned according to current medical practices, but problems sometimes occur. Call your health care provider if you have any problems or questions. °What can I expect after the procedure? °After vaginal delivery, it is common to have: °· Some bleeding from your vagina. °· Soreness in your abdomen, your vagina, and the area of skin between your vaginal opening and your anus (perineum). °· Pelvic cramps. °· Fatigue. ° °Follow these instructions at home: °Medicines °· Take over-the-counter and prescription medicines only as told by your health care provider. °· If you were prescribed an antibiotic medicine, take it as told by your health care provider. Do not stop taking the antibiotic until it is finished. °Driving ° °· Do not drive or operate heavy machinery while taking prescription pain medicine. °· Do not drive for 24 hours if you received a sedative. °Lifestyle °· Do not drink alcohol. This is especially important if you are breastfeeding or taking medicine to relieve pain. °· Do not use tobacco products, including cigarettes, chewing tobacco, or e-cigarettes. If you need help quitting, ask your health care provider. °Eating and drinking °· Drink at least 8 eight-ounce glasses of water every day unless you are told not to by your health care provider. If you choose to breastfeed your baby, you may need to drink more water than this. °· Eat high-fiber foods every day. These foods may help prevent or relieve constipation. High-fiber foods include: °? Whole grain cereals and breads. °? Brown rice. °? Beans. °? Fresh fruits and vegetables. °Activity °· Return to your normal activities as told by your health care provider. Ask your health care provider  what activities are safe for you. °· Rest as much as possible. Try to rest or take a nap when your baby is sleeping. °· Do not lift anything that is heavier than your baby or 10 lb (4.5 kg) until your health care provider says that it is safe. °· Talk with your health care provider about when you can engage in sexual activity. This may depend on your: °? Risk of infection. °? Rate of healing. °? Comfort and desire to engage in sexual activity. °Vaginal Care °· If you have an episiotomy or a vaginal tear, check the area every day for signs of infection. Check for: °? More redness, swelling, or pain. °? More fluid or blood. °? Warmth. °? Pus or a bad smell. °· Do not use tampons or douches until your health care provider says this is safe. °· Watch for any blood clots that may pass from your vagina. These may look like clumps of dark red, brown, or black discharge. °General instructions °· Keep your perineum clean and dry as told by your health care provider. °· Wear loose, comfortable clothing. °· Wipe from front to back when you use the toilet. °· Ask your health care provider if you can shower or take a bath. If you had an episiotomy or a perineal tear during labor and delivery, your health care provider may tell you not to take baths for a certain length of time. °· Wear a bra that supports your breasts and fits you well. °· If possible, have someone help you with household activities and help care for your baby for at least a few days after   you leave the hospital. °· Keep all follow-up visits for you and your baby as told by your health care provider. This is important. °Contact a health care provider if: °· You have: °? Vaginal discharge that has a bad smell. °? Difficulty urinating. °? Pain when urinating. °? A sudden increase or decrease in the frequency of your bowel movements. °? More redness, swelling, or pain around your episiotomy or vaginal tear. °? More fluid or blood coming from your episiotomy or  vaginal tear. °? Pus or a bad smell coming from your episiotomy or vaginal tear. °? A fever. °? A rash. °? Little or no interest in activities you used to enjoy. °? Questions about caring for yourself or your baby. °· Your episiotomy or vaginal tear feels warm to the touch. °· Your episiotomy or vaginal tear is separating or does not appear to be healing. °· Your breasts are painful, hard, or turn red. °· You feel unusually sad or worried. °· You feel nauseous or you vomit. °· You pass large blood clots from your vagina. If you pass a blood clot from your vagina, save it to show to your health care provider. Do not flush blood clots down the toilet without having your health care provider look at them. °· You urinate more than usual. °· You are dizzy or light-headed. °· You have not breastfed at all and you have not had a menstrual period for 12 weeks after delivery. °· You have stopped breastfeeding and you have not had a menstrual period for 12 weeks after you stopped breastfeeding. °Get help right away if: °· You have: °? Pain that does not go away or does not get better with medicine. °? Chest pain. °? Difficulty breathing. °? Blurred vision or spots in your vision. °? Thoughts about hurting yourself or your baby. °· You develop pain in your abdomen or in one of your legs. °· You develop a severe headache. °· You faint. °· You bleed from your vagina so much that you fill two sanitary pads in one hour. °This information is not intended to replace advice given to you by your health care provider. Make sure you discuss any questions you have with your health care provider. °Document Released: 04/29/2000 Document Revised: 10/14/2015 Document Reviewed: 05/17/2015 °Elsevier Interactive Patient Education © 2018 [employer] ° °

## 2017-12-05 NOTE — Discharge Summary (Signed)
I confirm that I have verified the information documented in the resident's note and that I have also personally reperformed the physical exam and all medical decision making activities.   Rolitta Dawson, CNM 11/28/2017 12:20 PM   OB Discharge Summary     Patient Name: Erica Shelton DOB: 05/31/1985 MRN: [id number]  Date of admission: 11/26/2017 Delivering MD: DEGELE, JULIE P   Date of discharge: 12/05/2017  Admitting diagnosis: 40wks, induction Intrauterine pregnancy: [redacted]w[redacted]d     Secondary diagnosis:  Active Problems:   High-risk pregnancy   SVD (spontaneous vaginal delivery)  Additional problems: fetal anomaly (cavum vergae, renal)     Discharge diagnosis: Term Pregnancy Delivered                                                                                                Post partum procedures:none  Augmentation: AROM, Pitocin and Foley Balloon  Complications: None  Hospital course:  Induction of Labor With Vaginal Delivery   32 y.o. yo G1P1001 at [redacted]w[redacted]d was admitted to the hospital 11/26/2017 for induction of labor.  Indication for induction: fetal anomaly.  Patient had an uncomplicated labor course as follows: Membrane Rupture Time/Date: 5:37 PM ,11/26/2017   Intrapartum Procedures: Episiotomy: None [1]                                         Lacerations:  1st degree [2];Perineal [11];Labial [10]  Patient had delivery of a Viable infant.  Information for the patient's newborn:  [place], Robert Liam [[id number]]  Delivery Method: Vaginal, Spontaneous(Filed from Delivery Summary)   11/26/2017  Details of delivery can be found in separate delivery note.  Patient had a routine postpartum course. Patient is discharged home 12/05/17.  Physical exam  Vitals:   11/27/17 0526 11/27/17 1500 11/27/17 2235 11/28/17 0628  BP: 103/65 106/64 104/62 102/70  Pulse: 82 76 68 71  Resp: 18 16 18 20  Temp: 98.1 F (36.7 C) 98.1 F (36.7 C) 98 F (36.7 C) 97.8 F (36.6 C)  TempSrc: Oral  Oral Oral Oral  SpO2: 98%     Weight:      Height:       General: alert, cooperative and no distress Lochia: appropriate Uterine Fundus: firm Incision: N/A DVT Evaluation: No evidence of DVT seen on physical exam. Labs: Lab Results  Component Value Date   WBC 14.5 (H) 11/26/2017   HGB 11.8 (L) 11/26/2017   HCT 35.7 (L) 11/26/2017   MCV 92.2 11/26/2017   PLT 283 11/26/2017   No flowsheet data found.  Discharge instruction: per After Visit Summary and "Baby and Me Booklet".  After visit meds:  Allergies as of 11/28/2017   No Known Allergies     Medication List    STOP taking these medications   Misc. Devices Kit   PRENATAL VITAMINS PLUS PO     TAKE these medications   ibuprofen 600 MG tablet Commonly known as:  ADVIL,MOTRIN Take 1 tablet (600 mg total) by mouth every 6 (six) hours.         Diet: routine diet  Activity: Advance as tolerated. Pelvic rest for 6 weeks.   Outpatient follow up:6 weeks Follow up Appt: Future Appointments  Date Time Provider Department Center  12/29/2017  9:30 AM Stinson, Jacob J, DO [facility] None   Follow up Visit:No follow-ups on file.  Postpartum contraception: Progesterone only pills  Newborn Data: Live born female  Birth Weight: 7 lb 13.9 oz (3570 g) APGAR: 8, 9  Newborn Delivery   Birth date/time:  11/26/2017 18:28:00 Delivery type:  Vaginal, Spontaneous     Baby Feeding: Breast Disposition:home with mother   12/05/2017 Daniel K. Olson, MD    

## 2017-12-29 ENCOUNTER — Ambulatory Visit (INDEPENDENT_AMBULATORY_CARE_PROVIDER_SITE_OTHER): Payer: Managed Care, Other (non HMO) | Admitting: Family Medicine

## 2017-12-29 ENCOUNTER — Encounter: Payer: Self-pay | Admitting: Family Medicine

## 2017-12-29 DIAGNOSIS — Z3202 Encounter for pregnancy test, result negative: Secondary | ICD-10-CM | POA: Diagnosis not present

## 2017-12-29 DIAGNOSIS — Z1389 Encounter for screening for other disorder: Secondary | ICD-10-CM | POA: Diagnosis not present

## 2017-12-29 DIAGNOSIS — Z30430 Encounter for insertion of intrauterine contraceptive device: Secondary | ICD-10-CM

## 2017-12-29 LAB — POCT URINE PREGNANCY: PREG TEST UR: NEGATIVE

## 2017-12-29 MED ORDER — LEVONORGESTREL 19.5 MCG/DAY IU IUD
INTRAUTERINE_SYSTEM | Freq: Once | INTRAUTERINE | Status: AC
Start: 2017-12-29 — End: 2017-12-29
  Administered 2017-12-29: 1 via INTRAUTERINE

## 2017-12-29 NOTE — Addendum Note (Signed)
Addended by: Lorelle GibbsWILSON, Brooklin Rieger L on: 12/29/2017 11:20 AM   Modules accepted: Orders

## 2017-12-29 NOTE — Progress Notes (Signed)
Subjective:     Erica Shelton is a 32 y.o. female who presents for a postpartum visit. She is 4 weeks postpartum following a spontaneous vaginal delivery. I have fully reviewed the prenatal and intrapartum course. The delivery was at 40 gestational weeks. Outcome: spontaneous vaginal delivery. Anesthesia: none. Postpartum course has been normal. Baby's course has been normal. Baby is feeding by breast. Bleeding no bleeding. Bowel function is abnormal: painful bowel movements . Bladder function is normal. Patient is not sexually active. Contraception method is IUD. Postpartum depression screening: negative.  Has some vaginal discomfort where she tore on the labia  The following portions of the patient's history were reviewed and updated as appropriate: allergies, current medications, past family history, past medical history, past social history, past surgical history and problem list.  Review of Systems Pertinent items are noted in HPI.   Objective:    There were no vitals taken for this visit.  General:  alert, cooperative and no distress  Lungs: clear to auscultation bilaterally  Heart:  regular rate and rhythm, S1, S2 normal, no murmur, click, rub or gallop  Abdomen: soft, non-tender; bowel sounds normal; no masses,  no organomegaly   Vulva:  normal. Healing well. Sutures where present - removed by me  Vagina: normal vagina, no discharge, exudate, lesion, or erythema  Cervix:  multiparous appearance, no cervical motion tenderness and no lesions  Rectal Exam: external hemorrhoids        IUD Procedure Note Patient identified, informed consent performed, signed copy in chart, time out was performed.  Urine pregnancy test negative.  Speculum placed in the vagina.  Cervix visualized.  Cleaned with Betadine x 2. Uterus sounded to 8 cm.  Liletta  IUD placed per manufacturer's recommendations.  Strings trimmed to 3 cm. Tenaculum was removed, good hemostasis noted.  Patient tolerated procedure  well.   Patient given post procedure instructions and Liletta care card with expiration date.  Patient is asked to check IUD strings periodically and follow up in 4-6 weeks for IUD check.   Assessment:     Normal postpartum exam. Pap smear not done at today's visit.   Plan:    1. Contraception: IUD 2. Possible irritation from suture. Call if not improved after removal 3. OTC preparation H for hemorrhoids. Call if not improving  Follow up in: 4 weeks or as needed.

## 2018-01-26 ENCOUNTER — Ambulatory Visit: Payer: Managed Care, Other (non HMO) | Admitting: Family Medicine

## 2018-02-02 ENCOUNTER — Encounter: Payer: Self-pay | Admitting: Family Medicine

## 2018-02-02 ENCOUNTER — Ambulatory Visit (INDEPENDENT_AMBULATORY_CARE_PROVIDER_SITE_OTHER): Payer: Managed Care, Other (non HMO) | Admitting: Family Medicine

## 2018-02-02 VITALS — BP 108/66 | HR 53 | Ht 67.0 in | Wt 198.0 lb

## 2018-02-02 DIAGNOSIS — Z30431 Encounter for routine checking of intrauterine contraceptive device: Secondary | ICD-10-CM | POA: Diagnosis not present

## 2018-02-02 NOTE — Progress Notes (Signed)
   Subjective:   Patient Name: Erica Shelton, female   DOB: 1985-12-04, 32 y.o.  MRN: [id number]  HPI Patient here for an IUD check.  She had the Liletta IUD placed 1 month ago.  She reports intermittent spotting.   Review of Systems  Constitutional: Negative for fever and chills.  Gastrointestinal: Negative for abdominal pain.  Genitourinary: Negative for vaginal discharge, vaginal pain, pelvic pain and dyspareunia.        Objective:   Physical Exam  Constitutional: She appears well-developed and well-nourished.  HENT:  Head: Normocephalic and atraumatic.  Abdominal: Soft. There is no tenderness. There is no guarding.  Genitourinary: There is no rash, tenderness or lesion on the right labia. There is no rash, tenderness or lesion on the left labia. No erythema or tenderness in the vagina. No foreign body around the vagina. No signs of injury around the vagina. No vaginal discharge found.    Skin: Skin is warm and dry.  Psychiatric: She has a normal mood and affect. Her behavior is normal. Judgment and thought content normal.       Assessment & Plan:  1. IUD check up IUD in place.  Pt to call with any other problems.  Recheck in 1 year.

## 2019-02-11 ENCOUNTER — Other Ambulatory Visit: Payer: Self-pay

## 2019-02-11 ENCOUNTER — Ambulatory Visit (INDEPENDENT_AMBULATORY_CARE_PROVIDER_SITE_OTHER): Payer: Managed Care, Other (non HMO) | Admitting: Family Medicine

## 2019-02-11 ENCOUNTER — Encounter: Payer: Self-pay | Admitting: Family Medicine

## 2019-02-11 VITALS — BP 134/71 | HR 50 | Ht 67.0 in | Wt 180.0 lb

## 2019-02-11 DIAGNOSIS — Z1151 Encounter for screening for human papillomavirus (HPV): Secondary | ICD-10-CM | POA: Diagnosis not present

## 2019-02-11 DIAGNOSIS — Z124 Encounter for screening for malignant neoplasm of cervix: Secondary | ICD-10-CM | POA: Diagnosis not present

## 2019-02-11 DIAGNOSIS — Z01419 Encounter for gynecological examination (general) (routine) without abnormal findings: Secondary | ICD-10-CM

## 2019-02-11 NOTE — Progress Notes (Signed)
GYNECOLOGY ANNUAL PREVENTATIVE CARE ENCOUNTER NOTE  Subjective:   Erica Shelton is a 33 y.o. G2P1001 female here for a routine annual gynecologic exam.  Current complaints: none.   Denies abnormal vaginal bleeding, discharge, pelvic pain, problems with intercourse or other gynecologic concerns.    No problems with IUD - light, regular menses  Gynecologic History Patient's last menstrual period was 02/05/2019 (exact date). Patient is sexually active  Contraception: IUD Last Pap: 2017. Results were: normal Last mammogram: n/a.  Obstetric History OB History  Gravida Para Term Preterm AB Living  1 1 1     1   SAB TAB Ectopic Multiple Live Births        0 1    # Outcome Date GA Lbr Len/2nd Weight Sex Delivery Anes PTL Lv  1 Term 11/26/17 [redacted]w[redacted]d 06:34 / 00:24 7 lb 13.9 oz (3.57 kg) M Vag-Spont None  LIV    Past Medical History:  Diagnosis Date  . Medical history non-contributory     Past Surgical History:  Procedure Laterality Date  . NO PAST SURGERIES      Current Outpatient Medications on File Prior to Visit  Medication Sig Dispense Refill  . Prenatal MV-Min-Fe Fum-FA-DHA (PRENATAL 1 PO) Take by mouth.     No current facility-administered medications on file prior to visit.     No Known Allergies  Social History   Socioeconomic History  . Marital status: Single    Spouse name: Not on file  . Number of children: Not on file  . Years of education: Not on file  . Highest education level: Not on file  Occupational History  . Not on file  Social Needs  . Financial resource strain: Not on file  . Food insecurity    Worry: Not on file    Inability: Not on file  . Transportation needs    Medical: Not on file    Non-medical: Not on file  Tobacco Use  . Smoking status: Never Smoker  . Smokeless tobacco: Never Used  Substance and Sexual Activity  . Alcohol use: No    Frequency: Never  . Drug use: No  . Sexual activity: Yes  Lifestyle  . Physical activity     Days per week: Not on file    Minutes per session: Not on file  . Stress: Not on file  Relationships  . Social [redacted]w[redacted]d on phone: Not on file    Gets together: Not on file    Attends religious service: Not on file    Active member of club or organization: Not on file    Attends meetings of clubs or organizations: Not on file    Relationship status: Not on file  . Intimate partner violence    Fear of current or ex partner: Not on file    Emotionally abused: Not on file    Physically abused: Not on file    Forced sexual activity: Not on file  Other Topics Concern  . Not on file  Social History Narrative  . Not on file    Family History  Adopted: Yes  Problem Relation Age of Onset  . Cancer Neg Hx     The following portions of the patient's history were reviewed and updated as appropriate: allergies, current medications, past family history, past medical history, past social history, past surgical history and problem list.  Review of Systems Pertinent items are noted in HPI.   Objective:  BP 134/71  Pulse (!) 50   Ht 5\' 7"  (1.702 m)   Wt 180 lb (81.6 kg)   LMP 02/05/2019 (Exact Date)   Breastfeeding Yes   BMI 28.19 kg/m  Wt Readings from Last 3 Encounters:  02/11/19 180 lb (81.6 kg)  02/02/18 198 lb 0.6 oz (89.8 kg)  12/29/17 200 lb (90.7 kg)     CONSTITUTIONAL: Well-developed, well-nourished female in no acute distress.  HENT:  Normocephalic, atraumatic, External right and left ear normal. Oropharynx is clear and moist EYES: Conjunctivae and EOM are normal. Pupils are equal, round, and reactive to light. No scleral icterus.  NECK: Normal range of motion, supple, no masses.  Normal thyroid.   CARDIOVASCULAR: Normal heart rate noted, regular rhythm RESPIRATORY: Clear to auscultation bilaterally. Effort and breath sounds normal, no problems with respiration noted. BREASTS: Symmetric in size. No masses, skin changes, nipple drainage, or  lymphadenopathy. ABDOMEN: Soft, normal bowel sounds, no distention noted.  No tenderness, rebound or guarding.  PELVIC: Normal appearing external genitalia; normal appearing vaginal mucosa and cervix.  No abnormal discharge noted.  Normal uterine size, no other palpable masses, no uterine or adnexal tenderness. IUD strings seen. MUSCULOSKELETAL: Normal range of motion. No tenderness.  No cyanosis, clubbing, or edema.  2+ distal pulses. SKIN: Skin is warm and dry. No rash noted. Not diaphoretic. No erythema. No pallor. NEUROLOGIC: Alert and oriented to person, place, and time. Normal reflexes, muscle tone coordination. No cranial nerve deficit noted. PSYCHIATRIC: Normal mood and affect. Normal behavior. Normal judgment and thought content.  Assessment:  Annual gynecologic examination with pap smear   Plan:  1. Well Woman Exam Will follow up results of pap smear and manage accordingly. STD testing discussed. Patient declined testing   Routine preventative health maintenance measures emphasized. Please refer to After Visit Summary for other counseling recommendations.    Loma [place], [place] for [employer]

## 2019-02-14 LAB — CYTOLOGY - PAP
Diagnosis: NEGATIVE
High risk HPV: NEGATIVE
Molecular Disclaimer: 56
Molecular Disclaimer: NORMAL

## 2019-05-27 ENCOUNTER — Encounter: Payer: Self-pay | Admitting: Emergency Medicine

## 2019-05-27 ENCOUNTER — Ambulatory Visit
Admission: EM | Admit: 2019-05-27 | Discharge: 2019-05-27 | Disposition: A | Discharge status: Home / Self Care | Payer: Managed Care, Other (non HMO) | Attending: Emergency Medicine | Admitting: Emergency Medicine

## 2019-05-27 ENCOUNTER — Other Ambulatory Visit: Payer: Self-pay

## 2019-05-27 ENCOUNTER — Telehealth: Payer: Managed Care, Other (non HMO)

## 2019-05-27 DIAGNOSIS — R0981 Nasal congestion: Secondary | ICD-10-CM

## 2019-05-27 DIAGNOSIS — Z20822 Contact with and (suspected) exposure to covid-19: Secondary | ICD-10-CM

## 2019-05-27 NOTE — ED Provider Notes (Signed)
[facility]    CSN: [id number] Arrival date & time: 05/27/19  0913      History   Chief Complaint Chief Complaint  Patient presents with  . URI    HPI Erica Shelton is a 34 y.o. female presenting for URI symptoms w/ cough and headache x 3 days.  Cough is dry, nonproductive, mostly in the morning when she clears her throat from postnasal drip.  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  Has tried mucinex with minimal relief.  She is gotten most symptom relief from increasing duration of her hot, steamy showers.   Past Medical History:  Diagnosis Date  . Medical history non-contributory     There are no problems to display for this patient.   Past Surgical History:  Procedure Laterality Date  . NO PAST SURGERIES      OB History    Gravida  1   Para  1   Term  1   Preterm      AB      Living  1     SAB      TAB      Ectopic      Multiple  0   Live Births  1            Home Medications    Prior to Admission medications   Medication Sig Start Date End Date Taking? Authorizing Provider  Prenatal MV-Min-Fe Fum-FA-DHA (PRENATAL 1 PO) Take by mouth.    [provider]    Family History Family History  Adopted: Yes  Problem Relation Age of Onset  . Cancer Neg Hx     Social History Social History   Tobacco Use  . Smoking status: Never Smoker  . Smokeless tobacco: Never Used  Substance Use Topics  . Alcohol use: No  . Drug use: No     Allergies   Patient has no known allergies.   Review of Systems Review of Systems  Constitutional: Negative for activity change, appetite change, fatigue and fever.  HENT: Positive for congestion, postnasal drip, rhinorrhea, sinus pressure and sore throat. Negative for dental problem, drooling, ear pain, facial swelling, hearing loss, sinus pain, trouble swallowing and voice change.   Eyes: Negative for photophobia, pain and visual  disturbance.  Respiratory: Positive for cough. Negative for shortness of breath, wheezing and stridor.        Productive cough in AM: sputum is yellow w/o blood  Cardiovascular: Negative for chest pain and palpitations.  Gastrointestinal: Negative for abdominal pain, diarrhea, nausea and vomiting.  Musculoskeletal: Negative for arthralgias and myalgias.  Neurological: Positive for headaches. Negative for dizziness, facial asymmetry, weakness and light-headedness.     Physical Exam Triage Vital Signs ED Triage Vitals  Enc Vitals Group     BP      Pulse      Resp      Temp      Temp src      SpO2      Weight      Height      Head Circumference      Peak Flow      Pain Score      Pain Loc      Pain Edu?      Excl. in GC?    No data found.  Updated Vital Signs BP 130/76 (BP Location: Right Arm)   Pulse 68   Temp 98.6 F (37 C) (  Oral)   Resp 16   SpO2 99%   Visual Acuity Right Eye Distance:   Left Eye Distance:   Bilateral Distance:    Right Eye Near:   Left Eye Near:    Bilateral Near:     Physical Exam Constitutional:      General: She is not in acute distress.    Appearance: She is normal weight. She is not toxic-appearing or diaphoretic.  HENT:     Head: Normocephalic and atraumatic.     Jaw: There is normal jaw occlusion. No tenderness or pain on movement.     Right Ear: Hearing, tympanic membrane, ear canal and external ear normal. No tenderness. No mastoid tenderness.     Left Ear: Hearing, tympanic membrane, ear canal and external ear normal. No tenderness. No mastoid tenderness.     Nose: Rhinorrhea present. No nasal deformity, septal deviation or nasal tenderness.     Right Turbinates: Not swollen or pale.     Left Turbinates: Not swollen or pale.     Right Sinus: No maxillary sinus tenderness or frontal sinus tenderness.     Left Sinus: No maxillary sinus tenderness or frontal sinus tenderness.     Comments: Bilateral turbinate edema (L>R) with  mucosal pallor.  Negative sinus tenderness    Mouth/Throat:     Lips: Pink. No lesions.     Mouth: Mucous membranes are moist. No injury.     Pharynx: Oropharynx is clear. Uvula midline. No posterior oropharyngeal erythema or uvula swelling.     Comments: no tonsillar exudate or hypertrophy Eyes:     General: No scleral icterus.    Conjunctiva/sclera: Conjunctivae normal.     Pupils: Pupils are equal, round, and reactive to light.  Cardiovascular:     Rate and Rhythm: Normal rate.     Heart sounds: No murmur. No gallop.   Pulmonary:     Effort: Pulmonary effort is normal. No respiratory distress.     Breath sounds: No wheezing.     Comments: CTAB Musculoskeletal:     Cervical back: Normal range of motion and neck supple. No tenderness. No muscular tenderness.  Lymphadenopathy:     Cervical: No cervical adenopathy.  Skin:    Capillary Refill: Capillary refill takes less than 2 seconds.  Neurological:     General: No focal deficit present.     Mental Status: She is alert and oriented to person, place, and time.      UC Treatments / Results  Labs (all labs ordered are listed, but only abnormal results are displayed) Labs Reviewed  NOVEL CORONAVIRUS, NAA    EKG   Radiology No results found.  Procedures Procedures (including critical care time)  Medications Ordered in UC Medications - No data to display  Initial Impression / Assessment and Plan / UC Course  I have reviewed the triage vital signs and the nursing notes.  Pertinent labs & imaging results that were available during my care of the patient were reviewed by me and considered in my medical decision making (see chart for details).     Patient afebrile, nontoxic, with SpO2 99%.  Covid PCR pending.  Patient to quarantine until results are back.  We will continue supportive management for nasal congestion including daytime antihistamine, intranasal steroid spray, humidifier, saline rinses once daily.  Return  precautions discussed, patient verbalized understanding and is agreeable to plan. Final Clinical Impressions(s) / UC Diagnoses   Final diagnoses:  Nasal congestion     Discharge Instructions  Your COVID test is pending - it is important to quarantine / isolate at home until your results are back. If you test positive and would like further evaluation for persistent or worsening symptoms, you may schedule an E-visit or virtual (video) visit throughout the [facility] app or website.  PLEASE NOTE: If you develop severe chest pain or shortness of breath please go to the ER or call 9-1-1 for further evaluation --> DO NOT schedule electronic or virtual visits for this. Please call our office for further guidance / recommendations as needed.    ED Prescriptions    None     PDMP not reviewed this encounter.   Hall-Potvin, [place], [place] 05/27/19 1032

## 2019-05-27 NOTE — ED Triage Notes (Signed)
Pt presents to [facility] for assessment of sinus pressure, sore throat, productive cough, headache x 2-3 days.  Denies n/v/d.

## 2019-05-27 NOTE — Discharge Instructions (Signed)
Your COVID test is pending - it is important to quarantine / isolate at home until your results are back. °If you test positive and would like further evaluation for persistent or worsening symptoms, you may schedule an E-visit or virtual (video) visit throughout the [facility] MyChart app or website. ° °PLEASE NOTE: If you develop severe chest pain or shortness of breath please go to the ER or call 9-1-1 for further evaluation --> DO NOT schedule electronic or virtual visits for this. °Please call our office for further guidance / recommendations as needed. °

## 2019-05-28 LAB — NOVEL CORONAVIRUS, NAA: SARS-CoV-2, NAA: NOT DETECTED

## 2019-10-24 ENCOUNTER — Telehealth: Payer: Self-pay | Admitting: Internal Medicine

## 2019-10-24 NOTE — Telephone Encounter (Signed)
Caller: Tresa Endo Call back phone number: [phone]  Erica Shelton was refer by her husband Erica, Shelton (MRN: [id number]).

## 2019-10-24 NOTE — Telephone Encounter (Signed)
Discussed w/ Dr. Drue Novel- okay to schedule NP appt at her convenience.

## 2019-11-12 ENCOUNTER — Ambulatory Visit: Payer: Managed Care, Other (non HMO) | Admitting: Internal Medicine

## 2019-12-03 ENCOUNTER — Other Ambulatory Visit: Payer: Self-pay

## 2019-12-03 ENCOUNTER — Encounter: Payer: Self-pay | Admitting: Internal Medicine

## 2019-12-03 ENCOUNTER — Ambulatory Visit (INDEPENDENT_AMBULATORY_CARE_PROVIDER_SITE_OTHER): Payer: Managed Care, Other (non HMO) | Admitting: Internal Medicine

## 2019-12-03 VITALS — BP 116/78 | HR 58 | Temp 97.9°F | Resp 16 | Ht 67.0 in | Wt 182.1 lb

## 2019-12-03 DIAGNOSIS — Z0000 Encounter for general adult medical examination without abnormal findings: Secondary | ICD-10-CM

## 2019-12-03 DIAGNOSIS — R7989 Other specified abnormal findings of blood chemistry: Secondary | ICD-10-CM

## 2019-12-03 DIAGNOSIS — E875 Hyperkalemia: Secondary | ICD-10-CM

## 2019-12-03 LAB — CBC WITH DIFFERENTIAL/PLATELET
Basophils Absolute: 0 10*3/uL (ref 0.0–0.1)
Basophils Relative: 0.5 % (ref 0.0–3.0)
Eosinophils Absolute: 0.1 10*3/uL (ref 0.0–0.7)
Eosinophils Relative: 2.7 % (ref 0.0–5.0)
HCT: 36.7 % (ref 36.0–46.0)
Hemoglobin: 12.2 g/dL (ref 12.0–15.0)
Lymphocytes Relative: 34.1 % (ref 12.0–46.0)
Lymphs Abs: 1.9 10*3/uL (ref 0.7–4.0)
MCHC: 33.1 g/dL (ref 30.0–36.0)
MCV: 94.2 fl (ref 78.0–100.0)
Monocytes Absolute: 0.4 10*3/uL (ref 0.1–1.0)
Monocytes Relative: 7.2 % (ref 3.0–12.0)
Neutro Abs: 3.1 10*3/uL (ref 1.4–7.7)
Neutrophils Relative %: 55.5 % (ref 43.0–77.0)
Platelets: 253 10*3/uL (ref 150.0–400.0)
RBC: 3.9 Mil/uL (ref 3.87–5.11)
RDW: 13.3 % (ref 11.5–15.5)
WBC: 5.5 10*3/uL (ref 4.0–10.5)

## 2019-12-03 LAB — COMPREHENSIVE METABOLIC PANEL
ALT: 12 U/L (ref 0–35)
AST: 16 U/L (ref 0–37)
Albumin: 4.2 g/dL (ref 3.5–5.2)
Alkaline Phosphatase: 54 U/L (ref 39–117)
BUN: 16 mg/dL (ref 6–23)
CO2: 31 mEq/L (ref 19–32)
Calcium: 9.8 mg/dL (ref 8.4–10.5)
Chloride: 105 mEq/L (ref 96–112)
Creatinine, Ser: 0.74 mg/dL (ref 0.40–1.20)
GFR: 89.57 mL/min (ref >60.00)
Glucose, Bld: 99 mg/dL (ref 70–99)
Potassium: 5.3 mEq/L — ABNORMAL HIGH (ref 3.5–5.1)
Sodium: 140 mEq/L (ref 135–145)
Total Bilirubin: 0.3 mg/dL (ref 0.2–1.2)
Total Protein: 6.4 g/dL (ref 6.0–8.3)

## 2019-12-03 LAB — LIPID PANEL
Cholesterol: 148 mg/dL (ref 0–200)
HDL: 73.7 mg/dL (ref >39.00)
LDL Cholesterol: 66 mg/dL (ref 0–99)
NonHDL: 74.06
Total CHOL/HDL Ratio: 2
Triglycerides: 40 mg/dL (ref 0.0–149.0)
VLDL: 8 mg/dL (ref 0.0–40.0)

## 2019-12-03 LAB — TSH: TSH: 4.95 u[IU]/mL — ABNORMAL HIGH (ref 0.35–4.50)

## 2019-12-03 MED ORDER — KETOCONAZOLE 2 % EX CREA: 1.0000 "application " | TOPICAL_CREAM | Freq: Every day | CUTANEOUS | 0 refills | Status: DC

## 2019-12-03 NOTE — Progress Notes (Signed)
Subjective:    Patient ID: Erica Shelton, female    DOB: 02-Aug-1985, 34 y.o.   MRN: [id number]  DOS:  12/03/2019 Type of visit - description: New patient, CPX In general feeling very well. She did complain of a rash @ axillary areas bilaterally, slightly bumpy and itchy.  Also, few days ago he was cleaning a carpet with some chemicals,  thinks she got a chemical burn on the right knee.   Review of Systems  Other than above, a 14 point review of systems is negative     Past Medical History:  Diagnosis Date  . Medical history non-contributory     Past Surgical History:  Procedure Laterality Date  . NO PAST SURGERIES     Family History  Adopted: Yes  Problem Relation Age of Onset  . Cancer Neg Hx      Allergies as of 12/03/2019   No Known Allergies     Medication List       Accurate as of December 03, 2019 11:59 PM. If you have any questions, ask your nurse or doctor.        ketoconazole 2 % cream Commonly known as: NIZORAL Apply 1 application topically daily. Started by: Willow Ora, MD   LILETTA (52 MG) IU by Intrauterine route.   loratadine 10 MG tablet Commonly known as: CLARITIN Take 10 mg by mouth daily.   PRENATAL 1 PO Take by mouth.          Objective:   Physical Exam Skin:        BP 116/78 (BP Location: Right Arm, Patient Position: Sitting, Cuff Size: Small)   Pulse (!) 58   Temp 97.9 F (36.6 C) (Oral)   Resp 16   Ht 5\' 7"  (1.702 m)   Wt 182 lb 2 oz (82.6 kg)   SpO2 100%   Breastfeeding Yes   BMI 28.52 kg/m  General: Well developed, NAD, BMI noted Neck: No  thyromegaly  HEENT:  Normocephalic . Face symmetric, atraumatic Lungs:  CTA B Normal respiratory effort, no intercostal retractions, no accessory muscle use. Heart: RRR,  no murmur.  Abdomen:  Not distended, soft, non-tender. No rebound or rigidity.   Lower extremities: no pretibial edema bilaterally  Skin: Exposed areas without rash. Not pale. Not jaundice Neurologic:   alert & oriented X3.  Speech normal, gait appropriate for age and unassisted Strength symmetric and appropriate for age.  Psych: Cognition and judgment appear intact.  Cooperative with normal attention span and concentration.  Behavior appropriate. No anxious or depressed appearing.     Assessment      Assessment: New patient BC: IUD  PLAN: New patient, CPX. Rash, underarms: Etiology not completely clear but suspect fungal, ketoconazole for 1 week, call if not better Rash, right knee: Agree with patient, probably a chemical burn, recommend observation and also if so desired apply OTC hydrocortisone RTC 1 year  Tdap 2019 Recommend flu shot q. year Hesitant to get a Covid vaccine: Discussed with patient pros and cons, encouraged to proceed. Female care: Per gynecology Lifestyle: She is trying to eat healthy, she is very busy due to her 9-year-old son and her job, still managing  to exercise twice a week.  Praised. Labs: CMP, FLP, CBC, TSH.      This visit occurred during the SARS-CoV-2 public health emergency.  Safety protocols were in place, including screening questions prior to the visit, additional usage of staff PPE, and extensive cleaning of exam room while observing  appropriate contact time as indicated for disinfecting solutions.

## 2019-12-03 NOTE — Progress Notes (Signed)
Pre visit review using our clinic review tool, if applicable. No additional management support is needed unless otherwise documented below in the visit note. 

## 2019-12-03 NOTE — Patient Instructions (Signed)
For the rash under your arms: Apply a cream called ketoconazole, I sent the prescription  For the rash at the right knee: You can leave it alone for I over-the-counter hydrocortisone 1% twice a day.  Call if you are not getting better  GO TO THE LAB : Get the blood work     GO TO THE FRONT DESK, PLEASE SCHEDULE YOUR APPOINTMENTS Come back for a physical exam in 1 year

## 2019-12-04 ENCOUNTER — Encounter: Payer: Self-pay | Admitting: Internal Medicine

## 2019-12-04 NOTE — Assessment & Plan Note (Signed)
Tdap 2019 Recommend flu shot q. year Hesitant to get a Covid vaccine: Discussed with patient pros and cons, encouraged to proceed. Female care: Per gynecology Lifestyle: She is trying to eat healthy, she is very busy due to her 34-year-old son and her job, still managing  to exercise twice a week.  Praised. Labs: CMP, FLP, CBC, TSH.

## 2019-12-05 NOTE — Addendum Note (Signed)
Addended byConrad [place] D on: 12/05/2019 02:15 PM   Modules accepted: Orders

## 2019-12-16 ENCOUNTER — Encounter: Payer: Self-pay | Admitting: Internal Medicine

## 2019-12-16 MED ORDER — KETOCONAZOLE 2 % EX CREA: 1.0000 "application " | TOPICAL_CREAM | Freq: Every day | CUTANEOUS | 0 refills | Status: DC

## 2020-01-07 ENCOUNTER — Other Ambulatory Visit: Payer: Self-pay

## 2020-01-07 ENCOUNTER — Other Ambulatory Visit (INDEPENDENT_AMBULATORY_CARE_PROVIDER_SITE_OTHER): Payer: Managed Care, Other (non HMO)

## 2020-01-07 DIAGNOSIS — R7989 Other specified abnormal findings of blood chemistry: Secondary | ICD-10-CM

## 2020-01-07 DIAGNOSIS — E875 Hyperkalemia: Secondary | ICD-10-CM | POA: Diagnosis not present

## 2020-01-07 NOTE — Addendum Note (Signed)
Addended by: Mervin Kung A on: 01/07/2020 07:07 AM   Modules accepted: Orders

## 2020-01-08 LAB — BASIC METABOLIC PANEL
BUN: 12 mg/dL (ref 7–25)
CO2: 21 mmol/L (ref 20–32)
Calcium: 9.6 mg/dL (ref 8.6–10.2)
Chloride: 105 mmol/L (ref 98–110)
Creat: 0.69 mg/dL (ref 0.50–1.10)
Glucose, Bld: 86 mg/dL (ref 65–99)
Potassium: 4.8 mmol/L (ref 3.5–5.3)
Sodium: 141 mmol/L (ref 135–146)

## 2020-01-08 LAB — TSH: TSH: 5 mIU/L — ABNORMAL HIGH

## 2020-01-08 LAB — T4, FREE: Free T4: 1.1 ng/dL (ref 0.8–1.8)

## 2020-07-07 ENCOUNTER — Other Ambulatory Visit: Payer: Self-pay

## 2020-07-07 DIAGNOSIS — E0590 Thyrotoxicosis, unspecified without thyrotoxic crisis or storm: Secondary | ICD-10-CM

## 2020-08-11 ENCOUNTER — Encounter: Payer: Self-pay | Admitting: Internal Medicine

## 2020-08-14 ENCOUNTER — Other Ambulatory Visit (INDEPENDENT_AMBULATORY_CARE_PROVIDER_SITE_OTHER): Payer: Managed Care, Other (non HMO)

## 2020-08-14 ENCOUNTER — Other Ambulatory Visit: Payer: Self-pay

## 2020-08-14 DIAGNOSIS — E0590 Thyrotoxicosis, unspecified without thyrotoxic crisis or storm: Secondary | ICD-10-CM | POA: Diagnosis not present

## 2020-08-14 LAB — T4, FREE: Free T4: 0.88 ng/dL (ref 0.60–1.60)

## 2020-08-14 LAB — TSH: TSH: 2.04 u[IU]/mL (ref 0.35–4.50)

## 2021-04-02 ENCOUNTER — Ambulatory Visit (INDEPENDENT_AMBULATORY_CARE_PROVIDER_SITE_OTHER): Payer: Managed Care, Other (non HMO) | Admitting: Internal Medicine

## 2021-04-02 ENCOUNTER — Other Ambulatory Visit: Payer: Self-pay

## 2021-04-02 ENCOUNTER — Encounter: Payer: Self-pay | Admitting: Internal Medicine

## 2021-04-02 VITALS — BP 134/84 | HR 65 | Temp 98.8°F | Resp 16 | Ht 67.0 in | Wt 182.5 lb

## 2021-04-02 DIAGNOSIS — N6489 Other specified disorders of breast: Secondary | ICD-10-CM

## 2021-04-02 DIAGNOSIS — M778 Other enthesopathies, not elsewhere classified: Secondary | ICD-10-CM | POA: Diagnosis not present

## 2021-04-02 NOTE — Progress Notes (Signed)
   Subjective:    Patient ID: Erica Shelton, female    DOB: 1986-01-02, 35 y.o.   MRN: [id number]  DOS:  04/02/2021 Type of visit - description: Acute  She has 2 concerns Right elbow pain for the last 3 months, located at the medial epicondyle. Denies any swelling, injury. She practices kickboxing but otherwise no overuse of her elbow. Pain increases with hand use certain arm positions.  On breast self-exam, the lateral aspect of the right breast feels different compared to the left without any specific lump. No nipple discharge no rash.   Review of Systems See above   Past Medical History:  Diagnosis Date   Medical history non-contributory     Past Surgical History:  Procedure Laterality Date   NO PAST SURGERIES      Allergies as of 04/02/2021   No Known Allergies      Medication List        Accurate as of April 02, 2021  1:40 PM. If you have any questions, ask your nurse or doctor.          ketoconazole 2 % cream Commonly known as: NIZORAL Apply 1 application topically daily.   LILETTA (52 MG) IU by Intrauterine route.   loratadine 10 MG tablet Commonly known as: CLARITIN Take 10 mg by mouth daily.   PRENATAL 1 PO Take by mouth.           Objective:   Physical Exam BP 134/84 (BP Location: Left Arm, Patient Position: Sitting, Cuff Size: Small)   Pulse 65   Temp 98.8 F (37.1 C) (Oral)   Resp 16   Ht 5\' 7"  (1.702 m)   Wt 182 lb 8 oz (82.8 kg)   SpO2 97%   Breastfeeding No   BMI 28.58 kg/m  General:   Well developed, NAD, BMI noted. HEENT:  Normocephalic . Face symmetric, atraumatic Breast: No axillary lymphadenopathies Nipple and skin normal L left breast normal R breast: On the external quadrants the breast tissue seems to be more prominent.  No specific lump or mass noted. Upper extremity: L elbow normal R elbow: Normal to inspection and palpation except for some TTP at the medial epicondyle without swelling.  Range of  motion normal. Lower extremities: no pretibial edema bilaterally  Skin: Not pale. Not jaundice Neurologic:  alert & oriented X3.  Speech normal, gait appropriate for age and unassisted Psych--  Cognition and judgment appear intact.  Cooperative with normal attention span and concentration.  Behavior appropriate. No anxious or depressed appearing.      Assessment      Assessment: New patient BC: IUD  PLAN: Tennis elbow: As described above, recommend the tennis elbow brace for 2 to 3 weeks, Voltaren gel, if no better will let me know, sports medicine referral? Asymmetric breast: I agree with the patient, the right breast has more dense tissue without specific mass. Never had any breast surgery, no FH of breast cancer, no nipple discharge. Plan: Mammogram and ultrasound. Preventive care: Recommend to see gynecology, recommend a CPX.   This visit occurred during the SARS-CoV-2 public health emergency.  Safety protocols were in place, including screening questions prior to the visit, additional usage of staff PPE, and extensive cleaning of exam room while observing appropriate contact time as indicated for disinfecting solutions.

## 2021-04-02 NOTE — Patient Instructions (Signed)
Elbow pain: Voltaren gel twice daily  Use a tennis elbow brace  Call if not better for the referral   We will schedule a mammogram and ultrasound  Please see your gynecologist you can call at [phone]    Schedule a physical with me at your convenience

## 2021-04-13 ENCOUNTER — Encounter: Payer: Self-pay | Admitting: Family Medicine

## 2021-04-22 ENCOUNTER — Other Ambulatory Visit: Payer: Self-pay

## 2021-04-22 ENCOUNTER — Encounter: Payer: Self-pay | Admitting: Obstetrics and Gynecology

## 2021-04-22 ENCOUNTER — Other Ambulatory Visit (HOSPITAL_COMMUNITY)
Admission: RE | Admit: 2021-04-22 | Discharge: 2021-04-22 | Disposition: A | Discharge status: Home / Self Care | Payer: Managed Care, Other (non HMO) | Source: Ambulatory Visit | Attending: Obstetrics and Gynecology | Admitting: Obstetrics and Gynecology

## 2021-04-22 ENCOUNTER — Ambulatory Visit (INDEPENDENT_AMBULATORY_CARE_PROVIDER_SITE_OTHER): Payer: Managed Care, Other (non HMO) | Admitting: Obstetrics and Gynecology

## 2021-04-22 VITALS — BP 118/72 | HR 65 | Ht 67.0 in | Wt 184.0 lb

## 2021-04-22 DIAGNOSIS — Z01419 Encounter for gynecological examination (general) (routine) without abnormal findings: Secondary | ICD-10-CM | POA: Insufficient documentation

## 2021-04-22 NOTE — Progress Notes (Signed)
Subjective:     Erica Shelton is a 34 y.o. female P1 with LMP 04/14/21 who is here for a comprehensive physical exam. The patient reports no problems. She reports a monthly period lasting 3-4 days. She is sexually active using Liletta IUD for contraception. She denies pelvic pain or abnormal discharge. She plans to conceive in 2023 and will return for IUD removal. Patient also reports a left breast mass which she noticed in November. She denies any pain or drainage from her breast. Patient is without any other complaints.   Past Medical History:  Diagnosis Date   Medical history non-contributory    Past Surgical History:  Procedure Laterality Date   NO PAST SURGERIES     Family History  Adopted: Yes  Problem Relation Age of Onset   Cancer Neg Hx     Social History   Socioeconomic History   Marital status: Married    Spouse name: Not on file   Number of children: 1   Years of education: Not on file   Highest education level: Not on file  Occupational History   Occupation: [employer]  Tobacco Use   Smoking status: Never   Smokeless tobacco: Never  Vaping Use   Vaping Use: Never used  Substance and Sexual Activity   Alcohol use: No   Drug use: No   Sexual activity: Yes  Other Topics Concern   Not on file  Social History Narrative   household pt, husband, son   P1G1   Social Determinants of Health   Financial Resource Strain: Not on file  Food Insecurity: Not on file  Transportation Needs: Not on file  Physical Activity: Not on file  Stress: Not on file  Social Connections: Not on file  Intimate Partner Violence: Not on file   Health Maintenance  Topic Date Due   Hepatitis C Screening  Never done   COVID-19 Vaccine (3 - Booster for [employer] series) 03/29/2020   INFLUENZA VACCINE  08/13/2021 (Originally 12/14/2020)   PAP SMEAR-Modifier  02/10/2022   TETANUS/TDAP  09/21/2027   HIV Screening  Completed   Pneumococcal Vaccine 5-54 Years old  Aged Out   HPV  VACCINES  Aged Out       Review of Systems Pertinent items noted in HPI and remainder of comprehensive ROS otherwise negative.   Objective:  Blood pressure 118/72, pulse 65, height 5\' 7"  (1.702 m), weight 184 lb (83.5 kg), last menstrual period 04/14/2021.   GENERAL: Well-developed, well-nourished female in no acute distress.  HEENT: Normocephalic, atraumatic. Sclerae anicteric.  NECK: Supple. Normal thyroid.  LUNGS: Clear to auscultation bilaterally.  HEART: Regular rate and rhythm. BREASTS: Symmetric in size. No palpable masses or lymphadenopathy, skin changes, or nipple drainage. Patient pointed to an area on the upper outer quadrant of her left breast with significant more fibrocystic changes. No discernable mass. ABDOMEN: Soft, nontender, nondistended. No organomegaly. PELVIC: Normal external female genitalia. Vagina is pink and rugated.  Normal discharge. Normal appearing cervix with IUD strings at the external os. Uterus is normal in size. No adnexal mass or tenderness. Chaperone present during the pelvic exam EXTREMITIES: No cyanosis, clubbing, or edema, 2+ distal pulses.     Assessment:    Healthy female exam.      Plan:    Pap smear collected Patient already scheduled for breast ultrasound and mammogram on 05/19/21 Patient will be contacted with abnormal results RTC for IUD removal  See After Visit Summary for Counseling Recommendations

## 2021-04-26 LAB — CYTOLOGY - PAP
Adequacy: ABSENT
Comment: NEGATIVE
Diagnosis: NEGATIVE
High risk HPV: NEGATIVE

## 2021-05-14 ENCOUNTER — Ambulatory Visit: Payer: Managed Care, Other (non HMO) | Admitting: Family Medicine

## 2021-05-16 NOTE — L&D Delivery Note (Signed)
OB/GYN Faculty Practice Delivery Note  Erica Shelton is a 36 y.o. G2P1001 s/p SVD at [redacted]w[redacted]d. She was admitted for IOL for twins.   ROM: 1h 61m with clear fluid GBS Status: Negative/-- (11/15 0823) Maximum Maternal Temperature: afebrile  Labor Progress: Initial SVE: 3/40/-3. She then progressed to complete.   Delivery Date/Time:  Delivery: Called to room and patient was complete and pushing. Twin A: Head delivered at 1350, vertex ROA. No nuchal cord present. Shoulder and body delivered in usual fashion. Infant with spontaneous cry, placed on mother's abdomen, dried and stimulated. Cord clamped x 2 after 1-minute delay, and cut by FOB. Cord blood drawn. Cord clamp placed on baby A's cord.   [place] used to determine baby B was vertex. On next contraction, AROM was performed with bloody fluid. Patient pushed. Twin B: Head delivered at 1357. Vertex, ROA. No nuchal cord present. Shoulder and body delivered in usual fashion. Infant with spontaneous cry, placed on mother's abdomen, dried and stimulated. Cord clamped x 2 after 1-minute delay, and cut by FOB. Cord blood drawn.   Placenta delivered spontaneously with gentle cord traction. Fundus firm with massage and Pitocin. Labia, perineum, vagina, and cervix inspected inspected with right labial and 1st degree perineal.  Baby Weight: pending  Placenta: Sent to pathology Complications: None Lacerations: 1st degree and right labial EBL: 227 mL Analgesia: none   Infant:  APGAR (1 MIN):    Emile, Ringgenberg [[id number]]  [address] [[id number]]  9    APGAR (5 MINS):    Glen, Blatchley [[id number]]  [address] [[id number]]  9    APGAR (10 MINS):    Cris, Gibby [[id number]]     Jodie, Cavey [[id number]]       Levie Heritage, DO [facility] 04/12/2022, 2:32 PM

## 2021-05-19 ENCOUNTER — Ambulatory Visit
Admission: RE | Admit: 2021-05-19 | Discharge: 2021-05-19 | Disposition: A | Discharge status: Home / Self Care | Payer: Managed Care, Other (non HMO) | Source: Ambulatory Visit | Attending: Internal Medicine | Admitting: Internal Medicine

## 2021-05-19 ENCOUNTER — Ambulatory Visit: Admission: RE | Admit: 2021-05-19 | Payer: Managed Care, Other (non HMO) | Source: Ambulatory Visit

## 2021-05-19 ENCOUNTER — Other Ambulatory Visit: Payer: Self-pay

## 2021-06-23 ENCOUNTER — Encounter: Payer: Self-pay | Admitting: Family Medicine

## 2021-06-23 ENCOUNTER — Ambulatory Visit (INDEPENDENT_AMBULATORY_CARE_PROVIDER_SITE_OTHER): Payer: Managed Care, Other (non HMO) | Admitting: Family Medicine

## 2021-06-23 ENCOUNTER — Other Ambulatory Visit: Payer: Self-pay

## 2021-06-23 ENCOUNTER — Encounter: Payer: Self-pay | Admitting: General Practice

## 2021-06-23 VITALS — BP 118/69 | HR 73 | Resp 16 | Ht 68.0 in | Wt 187.0 lb

## 2021-06-23 DIAGNOSIS — Z30432 Encounter for removal of intrauterine contraceptive device: Secondary | ICD-10-CM

## 2021-06-23 NOTE — Progress Notes (Signed)
Patient here for IUD removal. She is attempting to get pregnant. Taking prenatal vitamin.  IUD Removal  Patient was in the dorsal lithotomy position, normal external genitalia was noted.  A speculum was placed in the patient's vagina, normal discharge was noted, no lesions. The multiparous cervix was visualized, no lesions, no abnormal discharge,  and was swabbed with Betadine using scopettes.  The strings of the IUD was grasped and pulled using ring forceps.  The IUD was successfully removed in its entirety.  Patient tolerated the procedure well.

## 2021-07-16 ENCOUNTER — Encounter: Payer: Self-pay | Admitting: Internal Medicine

## 2021-08-23 ENCOUNTER — Encounter: Payer: Self-pay | Admitting: Family Medicine

## 2021-08-23 ENCOUNTER — Ambulatory Visit (INDEPENDENT_AMBULATORY_CARE_PROVIDER_SITE_OTHER): Payer: Managed Care, Other (non HMO) | Admitting: Family Medicine

## 2021-08-23 VITALS — BP 114/72 | HR 81 | Temp 98.4°F | Ht 68.0 in | Wt 176.6 lb

## 2021-08-23 DIAGNOSIS — J029 Acute pharyngitis, unspecified: Secondary | ICD-10-CM

## 2021-08-23 DIAGNOSIS — J039 Acute tonsillitis, unspecified: Secondary | ICD-10-CM | POA: Diagnosis not present

## 2021-08-23 LAB — POCT RAPID STREP A (OFFICE): Rapid Strep A Screen: NEGATIVE

## 2021-08-23 MED ORDER — AMOXICILLIN 500 MG PO TABS: 500.0000 mg | ORAL_TABLET | Freq: Two times a day (BID) | ORAL | 0 refills | Status: AC

## 2021-08-23 NOTE — Progress Notes (Signed)
? ?Acute Office Visit ? ?Subjective:  ? ? Patient ID: Erica Shelton, female    DOB: 08-Jun-1985, 36 y.o.   MRN: [id number] ? ?CC: sore throat ? ? ?HPI ?Patient is in today for sore throat. ? ?Patient reports that she started with a mild sore throat that gradually worsened. She is now having significant sore throat with pain radiating into left neck/ear, swollen lymph nodes, pain with swallowing/talking. She has started noticing exudate on her tonsils. She was running low grade fevers over the weekend, but none today. Of note she is [redacted] weeks pregnant and will get some mild nausea since not snacking as much as would be ideal to prevent morning sickness. No vomiting, cough, abdominal pain, rhinorrhea, sinus pressure, dyspnea.  ? ? ? ?Past Medical History:  ?Diagnosis Date  ? Medical history non-contributory   ? ? ?Past Surgical History:  ?Procedure Laterality Date  ? NO PAST SURGERIES    ? ? ?Family History  ?Adopted: Yes  ?Problem Relation Age of Onset  ? Cancer Neg Hx   ? ? ?Social History  ? ?Socioeconomic History  ? Marital status: Married  ?  Spouse name: Not on file  ? Number of children: 1  ? Years of education: Not on file  ? Highest education level: Not on file  ?Occupational History  ? Occupation: [place]  ?Tobacco Use  ? Smoking status: Never  ? Smokeless tobacco: Never  ?Vaping Use  ? Vaping Use: Never used  ?Substance and Sexual Activity  ? Alcohol use: No  ? Drug use: No  ? Sexual activity: Yes  ?  Birth control/protection: None  ?Other Topics Concern  ? Not on file  ?Social History Narrative  ? household pt, husband, son  ? P1G1  ? ?Social Determinants of Health  ? ?Financial Resource Strain: Not on file  ?Food Insecurity: Not on file  ?Transportation Needs: Not on file  ?Physical Activity: Not on file  ?Stress: Not on file  ?Social Connections: Not on file  ?Intimate Partner Violence: Not on file  ? ? ?Outpatient Medications Prior to Visit  ?Medication Sig Dispense Refill  ? loratadine (CLARITIN)  10 MG tablet Take 10 mg by mouth daily.    ? Prenatal Vit-Fe Fumarate-FA (PRENATAL VITAMIN PO) Take by mouth.    ? ?No facility-administered medications prior to visit.  ? ? ?No Known Allergies ? ?Review of Systems ?All review of systems negative except what is listed in the HPI ? ?   ?Objective:  ?  ?Physical Exam ?Vitals reviewed.  ?Constitutional:   ?   Appearance: Normal appearance.  ?HENT:  ?   Head: Normocephalic and atraumatic.  ?   Right Ear: Tympanic membrane normal.  ?   Left Ear: Tympanic membrane normal.  ?   Mouth/Throat:  ?   Lips: Pink.  ?   Mouth: Mucous membranes are moist.  ?   Pharynx: Uvula midline. Pharyngeal swelling present.  ?   Tonsils: Tonsillar exudate present. 2+ on the right. 2+ on the left.  ?Eyes:  ?   Conjunctiva/sclera: Conjunctivae normal.  ?Cardiovascular:  ?   Rate and Rhythm: Normal rate and regular rhythm.  ?Pulmonary:  ?   Effort: Pulmonary effort is normal.  ?   Breath sounds: Normal breath sounds.  ?Musculoskeletal:  ?   Cervical back: Normal range of motion and neck supple.  ?Lymphadenopathy:  ?   Cervical: Cervical adenopathy present.  ?Skin: ?   General: Skin is warm and dry.  ?  Neurological:  ?   General: No focal deficit present.  ?   Mental Status: She is alert and oriented to person, place, and time. Mental status is at baseline.  ?Psychiatric:     ?   Mood and Affect: Mood normal.     ?   Behavior: Behavior normal.     ?   Thought Content: Thought content normal.     ?   Judgment: Judgment normal.  ? ? ?BP 114/72   Pulse 81   Temp 98.4 ?F (36.9 ?C)   Ht 5\' 8"  (1.727 m)   Wt 176 lb 9.6 oz (80.1 kg)   LMP 04/14/2021   BMI 26.85 kg/m?  ?Wt Readings from Last 3 Encounters:  ?08/23/21 176 lb 9.6 oz (80.1 kg)  ?06/23/21 187 lb (84.8 kg)  ?04/22/21 184 lb (83.5 kg)  ? ? ?Health Maintenance Due  ?Topic Date Due  ? Hepatitis C Screening  Never done  ? COVID-19 Vaccine (3 - Booster for Pfizer series) 03/29/2020  ? ? ?There are no preventive care reminders to display for  this patient. ? ? ?Lab Results  ?Component Value Date  ? TSH 2.04 08/14/2020  ? ?Lab Results  ?Component Value Date  ? WBC 5.5 12/03/2019  ? HGB 12.2 12/03/2019  ? HCT 36.7 12/03/2019  ? MCV 94.2 12/03/2019  ? PLT 253.0 12/03/2019  ? ?Lab Results  ?Component Value Date  ? NA 141 01/07/2020  ? K 4.8 01/07/2020  ? CO2 21 01/07/2020  ? GLUCOSE 86 01/07/2020  ? BUN 12 01/07/2020  ? CREATININE 0.69 01/07/2020  ? BILITOT 0.3 12/03/2019  ? ALKPHOS 54 12/03/2019  ? AST 16 12/03/2019  ? ALT 12 12/03/2019  ? PROT 6.4 12/03/2019  ? ALBUMIN 4.2 12/03/2019  ? CALCIUM 9.6 01/07/2020  ? GFR 89.57 12/03/2019  ? ?Lab Results  ?Component Value Date  ? CHOL 148 12/03/2019  ? ?Lab Results  ?Component Value Date  ? HDL 73.70 12/03/2019  ? ?Lab Results  ?Component Value Date  ? [place] 66 12/03/2019  ? ?Lab Results  ?Component Value Date  ? TRIG 40.0 12/03/2019  ? ?Lab Results  ?Component Value Date  ? CHOLHDL 2 12/03/2019  ? ?No results found for: HGBA1C ? ?   ?Assessment & Plan:  ? ?1. Sore throat ?2. Tonsillitis ?Negative strep test in office but possibly false negative. Given presentation and recent exposure will go ahead and treat with amoxicillin. Could probably benefit from prednisone, but prefer to avoid at this time given early pregnancy. Follow-up if not noticing some improvement by the end of the week. Continue supportive measures at home.  ?Patient aware of signs/symptoms requiring further/urgent evaluation.  ? ?- amoxicillin (AMOXIL) 500 MG tablet; Take 1 tablet (500 mg total) by mouth 2 (two) times daily for 10 days.  Dispense: 20 tablet; Refill: 0 ?- POCT rapid strep A ? ? ?Please contact office for follow-up if symptoms do not improve or worsen. Seek emergency care if symptoms become severe. ? ? ?Terrilyn Saver, NP ? ?

## 2021-08-23 NOTE — Patient Instructions (Addendum)
Negative strep test in office but possibly false negative. Given presentation and recent exposure will go ahead and treat with amoxicillin. Could probably benefit from prednisone, but prefer to avoid at this time given early pregnancy. Follow-up if not noticing some improvement by the end of the week. Continue supportive measures at home.  ?

## 2021-08-24 ENCOUNTER — Ambulatory Visit: Payer: Managed Care, Other (non HMO) | Admitting: Internal Medicine

## 2021-09-13 ENCOUNTER — Encounter: Payer: Self-pay | Admitting: Family Medicine

## 2021-09-24 ENCOUNTER — Ambulatory Visit (HOSPITAL_BASED_OUTPATIENT_CLINIC_OR_DEPARTMENT_OTHER): Payer: Managed Care, Other (non HMO)

## 2021-09-24 ENCOUNTER — Encounter: Payer: Self-pay | Admitting: Family Medicine

## 2021-09-24 ENCOUNTER — Other Ambulatory Visit (HOSPITAL_COMMUNITY)
Admission: RE | Admit: 2021-09-24 | Discharge: 2021-09-24 | Disposition: A | Discharge status: Home / Self Care | Payer: Managed Care, Other (non HMO) | Source: Ambulatory Visit | Attending: Family Medicine | Admitting: Family Medicine

## 2021-09-24 ENCOUNTER — Ambulatory Visit: Payer: Managed Care, Other (non HMO) | Admitting: *Deleted

## 2021-09-24 ENCOUNTER — Ambulatory Visit (INDEPENDENT_AMBULATORY_CARE_PROVIDER_SITE_OTHER): Payer: Managed Care, Other (non HMO) | Admitting: Family Medicine

## 2021-09-24 ENCOUNTER — Other Ambulatory Visit: Payer: Self-pay | Admitting: *Deleted

## 2021-09-24 ENCOUNTER — Ambulatory Visit (HOSPITAL_BASED_OUTPATIENT_CLINIC_OR_DEPARTMENT_OTHER): Payer: Managed Care, Other (non HMO) | Admitting: Obstetrics

## 2021-09-24 ENCOUNTER — Encounter: Payer: Self-pay | Admitting: *Deleted

## 2021-09-24 VITALS — BP 136/65 | HR 77

## 2021-09-24 VITALS — BP 120/65 | HR 71 | Wt 182.0 lb

## 2021-09-24 DIAGNOSIS — O30091 Twin pregnancy, unable to determine number of placenta and number of amniotic sacs, first trimester: Secondary | ICD-10-CM | POA: Insufficient documentation

## 2021-09-24 DIAGNOSIS — O09529 Supervision of elderly multigravida, unspecified trimester: Secondary | ICD-10-CM

## 2021-09-24 DIAGNOSIS — Z3480 Encounter for supervision of other normal pregnancy, unspecified trimester: Secondary | ICD-10-CM | POA: Insufficient documentation

## 2021-09-24 DIAGNOSIS — O30042 Twin pregnancy, dichorionic/diamniotic, second trimester: Secondary | ICD-10-CM | POA: Insufficient documentation

## 2021-09-24 DIAGNOSIS — O09521 Supervision of elderly multigravida, first trimester: Secondary | ICD-10-CM

## 2021-09-24 DIAGNOSIS — O30041 Twin pregnancy, dichorionic/diamniotic, first trimester: Secondary | ICD-10-CM | POA: Insufficient documentation

## 2021-09-24 DIAGNOSIS — Z3A09 9 weeks gestation of pregnancy: Secondary | ICD-10-CM

## 2021-09-24 DIAGNOSIS — O30092 Twin pregnancy, unable to determine number of placenta and number of amniotic sacs, second trimester: Secondary | ICD-10-CM

## 2021-09-24 NOTE — Progress Notes (Signed)
DATING AND VIABILITY SONOGRAM ? ? ?Erica Shelton is a 36 y.o. year old G2P1001 with LMP Patient's last menstrual period was 07/18/2021. which would correlate to  [redacted]w[redacted]d weeks gestation.  She has regular menstrual cycles.   She is here today for a confirmatory initial sonogram. ? ? ? ?GESTATION: ?TWINS   ? ?FETAL ACTIVITY: ?         Heart rate         BABY A 159bpm ?   BABY B 161 bpm ?         The both fetus are active. ? ?PLACENTA LOCALIZATION: ? anterior ? ? ?ADNEXA: ?The ovaries are normal. ? ? ?GESTATIONAL AGE AND  BIOMETRICS: ? ?Gestational criteria: Estimated Date of Delivery: 04/24/22 by LMP now at [redacted]w[redacted]d ? ?Previous Scans:0 ? ?    ?CROWN RUMP LENGTH    A           3.59 cm         10-3 weeks  ? CRL BABY B      3.19 cm   10-1 weeks  ?    ?    ?    ?    ?    ?    ? ?                                                AVERAGE EGA(BY THIS SCAN):  10-2 weeks ? ?WORKING EDD( LMP ):  04/24/2022 ?  ? ? ?TECHNICIAN COMMENTS: ?Patient informed that the ultrasound is considered a limited obstetric ultrasound and is not intended to be a complete ultrasound exam.  Patient also informed that the ultrasound is not being completed with the intent of assessing for fetal or placental anomalies or any pelvic abnormalities. Explained that the purpose of today's ultrasound is to assess for fetal heart rate.  Patient acknowledges the purpose of the exam and the limitations of the study.    ? ? ?Armandina Stammer ?09/24/2021 ?9:37 AM ?  ?

## 2021-09-24 NOTE — Progress Notes (Signed)
?Subjective:  ?Erica Shelton is a G2P1001 [redacted]w[redacted]d by LMP consistent with [place] today, being seen today for her first obstetrical visit.  Her obstetrical history is significant for  prior normal SVD . Patient does intend to breast feed. Pregnancy history fully reviewed. ? ?Patient reports nausea. ? ?BP 120/65   Pulse 71   Wt 182 lb (82.6 kg)   LMP 07/18/2021   BMI 27.67 kg/m?  ? ?HISTORY: ?OB History  ?Gravida Para Term Preterm AB Living  ?2 1 1     1   ?SAB IAB Ectopic Multiple Live Births  ?      0 1  ?  ?# Outcome Date GA Lbr Len/2nd Weight Sex Delivery Anes PTL Lv  ?2 Current           ?1 Term 11/26/17 [redacted]w[redacted]d 06:34 / 00:24 7 lb 13.9 oz (3.57 kg) M Vag-Spont None  LIV  ? ? ?Past Medical History:  ?Diagnosis Date  ? Medical history non-contributory   ? ? ?Past Surgical History:  ?Procedure Laterality Date  ? NO PAST SURGERIES    ? ? ?Family History  ?Adopted: Yes  ?Problem Relation Age of Onset  ? Cancer Neg Hx   ? ? ? ?Exam  ?BP 120/65   Pulse 71   Wt 182 lb (82.6 kg)   LMP 07/18/2021   BMI 27.67 kg/m?  ? ?Chaperone present during exam ? ?CONSTITUTIONAL: Well-developed, well-nourished female in no acute distress.  ?HENT:  Normocephalic, atraumatic, External right and left ear normal. Oropharynx is clear and moist ?EYES: Conjunctivae and EOM are normal. Pupils are equal, round, and reactive to light. No scleral icterus.  ?NECK: Normal range of motion, supple, no masses.  Normal thyroid.  ?CARDIOVASCULAR: Normal heart rate noted, regular rhythm ?RESPIRATORY: Clear to auscultation bilaterally. Effort and breath sounds normal, no problems with respiration noted. ?BREASTS: had recent annual exam ?ABDOMEN: Soft, normal bowel sounds, no distention noted.  No tenderness, rebound or guarding.  ?PELVIC: had recent annual exam ?MUSCULOSKELETAL: Normal range of motion. No tenderness.  No cyanosis, clubbing, or edema.  2+ distal pulses. ?SKIN: Skin is warm and dry. No rash noted. Not diaphoretic. No erythema. No  pallor. ?NEUROLOGIC: Alert and oriented to person, place, and time. Normal reflexes, muscle tone coordination. No cranial nerve deficit noted. ?PSYCHIATRIC: Normal mood and affect. Normal behavior. Normal judgment and thought content. ? ?  ?Assessment:  ? ? Pregnancy: G2P1001 ?Patient Active Problem List  ? Diagnosis Date Noted  ? Supervision of other normal pregnancy, antepartum 09/24/2021  ? Twin gestation, unable to determine number of placenta and number of amniotic sacs in first trimester 09/24/2021  ? Annual physical exam   ? ? ?  ?Plan:  ? ?1. Supervision of other normal pregnancy, antepartum ?FHT normal ?- CBC/D/Plt+RPR+Rh+ABO+RubIgG... ?- GC/Chlamydia probe amp ([place])not at [facility] ?- Culture, OB Urine ?- [facility] MFM OB Comp Less 14 Wks; Future ?- [place] MFM OB Comp Addl Gest Less 14 Wks; Future ? ?2. Twin gestation, unable to determine number of placenta and number of amniotic sacs in first trimester ?We do see a dividing line, so diamniotic. Check [place] to evaluate for chorionicity.  ?Dietary recommendations for twin pregnancy discussed. ?Start ASA 81mg  at 12 weeks. ?- CBC/D/Plt+RPR+Rh+ABO+RubIgG... ?- GC/Chlamydia probe amp ([place])not at [facility] ?- Culture, OB Urine ?- MFM OB Comp Less 14 Wks; Future ?- [facility] MFM OB Comp Addl Gest Less 14 Wks; Future ? ?3. Antepartum multigravida of advanced maternal age ?  ?Initial  labs obtained ?Continue prenatal vitamins ?Reviewed n/v relief measures and warning s/s to report ?Reviewed recommended weight gain based on pre-gravid BMI ?Encouraged well-balanced diet ?Genetic & carrier screening discussed: declines Panorama,  ?Ultrasound discussed; fetal survey: requested ?CCNC completed> form faxed if has or is planning to apply for medicaid ?The nature of [place] [facility] with multiple MDs and other Advanced Practice Providers was explained to patient; also emphasized that fellows, residents, and students are part of our team. ? ? ?Problem list  reviewed and updated. ?75% of 30 min visit spent on counseling and coordination of care.  ?  ? ?Levie Heritage ?09/24/2021 ?

## 2021-09-24 NOTE — Patient Instructions (Signed)
Twin Dietary Recommendations: ? ?Eat every 3-4 hours: at least 3 meals plus 2-3 snacks per day ?Iron supplement twice a day and lean protein: fish, poultry, dairy, nuts ?Complex carbohydrates: fruits, legumes, and whole grains ?Daily mineral supplements: Calcium 3g, Magnesium 1.2g, Zinc 45mg  ?Healthy fats: mono and poly unsaturated (olive oil, canola oil, nuts, seeds). Limit saturated fats and trans fats ?Omega-3 fatty acid rich fish protein 2-4x per week ? ? ?

## 2021-09-24 NOTE — Progress Notes (Signed)
MFM Note ? ?Erica Shelton was seen due to a spontaneously conceived twin pregnancy.  She is of advanced maternal age.   ? ?She denies any significant past medical history and denies any problems in her current pregnancy.    ? ?The chronicity could not be determined based on an ultrasound performed earlier today. ? ?On today's exam, a thick dividing membrane was noted separating the two fetuses, indicating that these are dichorionic, diamniotic twins. ? ?The crown-rump length for both twin A and twin B measured appropriate for her gestational age, confirming an [facility] of April 24, 2022. ? ?The management of dichorionic twins was discussed.  She was advised that management of twin pregnancies will involve frequent ultrasound exams to assess the fetal growth and amniotic fluid level.  ? ?A follow-up exam was scheduled in 3 weeks to confirm her dates and chronicity.  A detailed fetal anatomy scan will be scheduled for her at around 19 weeks.  We will then follow her with monthly growth ultrasounds.   ? ?Weekly fetal testing should be started at between 34 to 36 weeks.  Delivery for uncomplicated dichorionic twins should occur at around 38 weeks. ? ?The increased risk of preeclampsia, gestational diabetes, and preterm birth/labor associated with twin pregnancies was discussed.  ? ?As pregnancies with multiple gestations are at increased risk for developing preeclampsia, she was advised to start taking 2 tablets of baby aspirin (81 mg per day) to decrease her risk of developing preeclampsia after 10 weeks. ? ?Due to advanced maternal age and to help determine the zygosity of the twin gestation, she will consider having a Panorama cell free DNA test drawn. ? ?A follow-up exam was scheduled in our office in 3 weeks.  We will help the patient obtain a Panorama cell free DNA test at her next visit if it has not been drawn already.   ? ?The patient stated that all of her questions were answered today. ? ?A total of 30 minutes  was spent counseling and coordinating the care for this patient.  Greater than 50% of the time was spent in direct face-to-face contact. ?

## 2021-09-26 LAB — CBC/D/PLT+RPR+RH+ABO+RUBIGG...
Antibody Screen: NEGATIVE
Basophils Absolute: 0 10*3/uL (ref 0.0–0.2)
Basos: 0 %
EOS (ABSOLUTE): 0 10*3/uL (ref 0.0–0.4)
Eos: 0 %
HCV Ab: NONREACTIVE
HIV Screen 4th Generation wRfx: NONREACTIVE
Hematocrit: 38.4 % (ref 34.0–46.6)
Hemoglobin: 12.7 g/dL (ref 11.1–15.9)
Hepatitis B Surface Ag: NEGATIVE
Immature Grans (Abs): 0 10*3/uL (ref 0.0–0.1)
Immature Granulocytes: 0 %
Lymphocytes Absolute: 1.9 10*3/uL (ref 0.7–3.1)
Lymphs: 19 %
MCH: 30.3 pg (ref 26.6–33.0)
MCHC: 33.1 g/dL (ref 31.5–35.7)
MCV: 92 fL (ref 79–97)
Monocytes Absolute: 0.5 10*3/uL (ref 0.1–0.9)
Monocytes: 5 %
Neutrophils Absolute: 7.4 10*3/uL — ABNORMAL HIGH (ref 1.4–7.0)
Neutrophils: 76 %
Platelets: 301 10*3/uL (ref 150–450)
RBC: 4.19 x10E6/uL (ref 3.77–5.28)
RDW: 13.3 % (ref 11.7–15.4)
RPR Ser Ql: NONREACTIVE
Rh Factor: POSITIVE
Rubella Antibodies, IGG: 1.04 index (ref >0.99)
WBC: 9.9 10*3/uL (ref 3.4–10.8)

## 2021-09-26 LAB — HCV INTERPRETATION

## 2021-09-26 LAB — CULTURE, OB URINE

## 2021-09-26 LAB — URINE CULTURE, OB REFLEX

## 2021-09-27 LAB — GC/CHLAMYDIA PROBE AMP (~~LOC~~) NOT AT ARMC
Chlamydia: NEGATIVE
Comment: NEGATIVE
Comment: NORMAL
Neisseria Gonorrhea: NEGATIVE

## 2021-10-15 ENCOUNTER — Ambulatory Visit: Payer: Managed Care, Other (non HMO) | Attending: Obstetrics

## 2021-10-15 ENCOUNTER — Other Ambulatory Visit: Payer: Self-pay | Admitting: *Deleted

## 2021-10-15 ENCOUNTER — Ambulatory Visit: Payer: Managed Care, Other (non HMO) | Admitting: *Deleted

## 2021-10-15 ENCOUNTER — Encounter: Payer: Self-pay | Admitting: *Deleted

## 2021-10-15 VITALS — BP 124/71 | HR 64

## 2021-10-15 DIAGNOSIS — O30041 Twin pregnancy, dichorionic/diamniotic, first trimester: Secondary | ICD-10-CM

## 2021-10-15 DIAGNOSIS — O09521 Supervision of elderly multigravida, first trimester: Secondary | ICD-10-CM

## 2021-10-15 DIAGNOSIS — O09511 Supervision of elderly primigravida, first trimester: Secondary | ICD-10-CM | POA: Diagnosis not present

## 2021-10-15 DIAGNOSIS — Z3A12 12 weeks gestation of pregnancy: Secondary | ICD-10-CM | POA: Diagnosis not present

## 2021-10-15 DIAGNOSIS — O30092 Twin pregnancy, unable to determine number of placenta and number of amniotic sacs, second trimester: Secondary | ICD-10-CM

## 2021-10-20 ENCOUNTER — Ambulatory Visit (INDEPENDENT_AMBULATORY_CARE_PROVIDER_SITE_OTHER): Payer: Managed Care, Other (non HMO) | Admitting: Family Medicine

## 2021-10-20 VITALS — BP 119/65 | HR 70 | Wt 196.0 lb

## 2021-10-20 DIAGNOSIS — O30042 Twin pregnancy, dichorionic/diamniotic, second trimester: Secondary | ICD-10-CM

## 2021-10-20 DIAGNOSIS — Z3480 Encounter for supervision of other normal pregnancy, unspecified trimester: Secondary | ICD-10-CM

## 2021-10-20 NOTE — Progress Notes (Signed)
   PRENATAL VISIT NOTE  Subjective:  Erica Shelton is a 36 y.o. G2P1001 at [redacted]w[redacted]d being seen today for ongoing prenatal care.  She is currently monitored for the following issues for this high-risk pregnancy and has Annual physical exam; Supervision of other normal pregnancy, antepartum; and Twin gestation, unable to determine number of placenta and number of amniotic sacs in first trimester on their problem list.  Patient reports no complaints.  Contractions: Not present. Vag. Bleeding: None.  Movement: Absent. Denies leaking of fluid.   The following portions of the patient's history were reviewed and updated as appropriate: allergies, current medications, past family history, past medical history, past social history, past surgical history and problem list.   Objective:   Vitals:   10/20/21 0948  BP: 119/65  Pulse: 70  Weight: 196 lb (88.9 kg)    Fetal Status: Fetal Heart Rate (bpm): 138/142   Movement: Absent     General:  Alert, oriented and cooperative. Patient is in no acute distress.  Skin: Skin is warm and dry. No rash noted.   Cardiovascular: Normal heart rate noted  Respiratory: Normal respiratory effort, no problems with respiration noted  Abdomen: Soft, gravid, appropriate for gestational age.  Pain/Pressure: Absent     Pelvic: Cervical exam deferred        Extremities: Normal range of motion.  Edema: None  Mental Status: Normal mood and affect. Normal behavior. Normal judgment and thought content.   Assessment and Plan:  Pregnancy: G2P1001 at [redacted]w[redacted]d 1. Supervision of other normal pregnancy, antepartum FHT and FH normal  2. Dichorionic diamniotic twin pregnancy in second trimester On ASA 81mg .   Preterm labor symptoms and general obstetric precautions including but not limited to vaginal bleeding, contractions, leaking of fluid and fetal movement were reviewed in detail with the patient. Please refer to After Visit Summary for other counseling recommendations.    No follow-ups on file.  Future Appointments  Date Time Provider [place]  11/17/2021 10:35 AM Truett Mainland, DO [facility] None  11/26/2021  7:30 AM [facility] NURSE [facility]  11/26/2021  7:45 AM [facility] US4 [facility]  12/15/2021 11:15 AM Truett Mainland, DO [facility] None    Truett Mainland, DO

## 2021-10-20 NOTE — Progress Notes (Signed)
Ultrasound had to be performed to obtain fetal heart rates. Armandina Stammer RN

## 2021-10-24 ENCOUNTER — Other Ambulatory Visit: Payer: Self-pay

## 2021-10-25 ENCOUNTER — Telehealth: Payer: Self-pay | Admitting: Genetics

## 2021-10-25 NOTE — Telephone Encounter (Signed)
Erica Shelton was contacted by telephone to review their noninvasive prenatal screening (NIPS) result. The result is low risk, consistent with fraternal twins. This screening significantly reduces the risk that the current pregnancy has Down syndrome, Trisomy 33, and Trisomy 13. Erica Shelton understands that this is a screening and not a diagnostic test. All questions answered. Tresa Endo requested that genetic counseling call her husband, Erica Shelton, with the gender information. Genetic counseling called Erica Shelton and gave this information to him.

## 2021-11-17 ENCOUNTER — Ambulatory Visit (INDEPENDENT_AMBULATORY_CARE_PROVIDER_SITE_OTHER): Payer: Managed Care, Other (non HMO) | Admitting: Family Medicine

## 2021-11-17 VITALS — BP 112/64 | HR 78 | Wt 205.0 lb

## 2021-11-17 DIAGNOSIS — O09529 Supervision of elderly multigravida, unspecified trimester: Secondary | ICD-10-CM

## 2021-11-17 DIAGNOSIS — Z3480 Encounter for supervision of other normal pregnancy, unspecified trimester: Secondary | ICD-10-CM

## 2021-11-17 DIAGNOSIS — O30042 Twin pregnancy, dichorionic/diamniotic, second trimester: Secondary | ICD-10-CM

## 2021-11-17 NOTE — Progress Notes (Signed)
   PRENATAL VISIT NOTE  Subjective:  Erica Shelton is a 36 y.o. G2P1001 at [redacted]w[redacted]d being seen today for ongoing prenatal care.  She is currently monitored for the following issues for this high-risk pregnancy and has Annual physical exam; Supervision of other normal pregnancy, antepartum; and Dichorionic diamniotic twin pregnancy in second trimester on their problem list.  Patient reports  good appetite .  Contractions: Not present. Vag. Bleeding: None.  Movement: Present. Denies leaking of fluid.   The following portions of the patient's history were reviewed and updated as appropriate: allergies, current medications, past family history, past medical history, past social history, past surgical history and problem list.   Objective:   Vitals:   11/17/21 1022  BP: 112/64  Pulse: 78  Weight: 205 lb (93 kg)    Fetal Status: Fetal Heart Rate (bpm): 140/130   Movement: Present     General:  Alert, oriented and cooperative. Patient is in no acute distress.  Skin: Skin is warm and dry. No rash noted.   Cardiovascular: Normal heart rate noted  Respiratory: Normal respiratory effort, no problems with respiration noted  Abdomen: Soft, gravid, appropriate for gestational age.  Pain/Pressure: Absent     Pelvic: Cervical exam deferred        Extremities: Normal range of motion.  Edema: None  Mental Status: Normal mood and affect. Normal behavior. Normal judgment and thought content.   Assessment and Plan:  Pregnancy: G2P1001 at [redacted]w[redacted]d 1. Supervision of other normal pregnancy, antepartum FHT and FH normal  2. Dichorionic diamniotic twin pregnancy in second trimester Has [place] for anatomy scan on 7/14. Panorama LR fraternal twins  3. Antepartum multigravida of advanced maternal age ASA 81mg .  Preterm labor symptoms and general obstetric precautions including but not limited to vaginal bleeding, contractions, leaking of fluid and fetal movement were reviewed in detail with the patient. Please  refer to After Visit Summary for other counseling recommendations.   No follow-ups on file.  Future Appointments  Date Time Provider Department Center  11/26/2021  7:30 AM [facility] NURSE [facility]  11/26/2021  7:45 AM [facility] US4 [facility]  12/15/2021 11:15 AM 02/14/2022, DO [facility] None    Levie Heritage, DO

## 2021-11-26 ENCOUNTER — Encounter: Payer: Self-pay | Admitting: *Deleted

## 2021-11-26 ENCOUNTER — Ambulatory Visit: Payer: Managed Care, Other (non HMO) | Admitting: *Deleted

## 2021-11-26 ENCOUNTER — Ambulatory Visit: Payer: Managed Care, Other (non HMO) | Attending: Obstetrics

## 2021-11-26 ENCOUNTER — Other Ambulatory Visit: Payer: Self-pay | Admitting: *Deleted

## 2021-11-26 VITALS — BP 122/59 | HR 68

## 2021-11-26 DIAGNOSIS — O09522 Supervision of elderly multigravida, second trimester: Secondary | ICD-10-CM | POA: Diagnosis not present

## 2021-11-26 DIAGNOSIS — O30042 Twin pregnancy, dichorionic/diamniotic, second trimester: Secondary | ICD-10-CM

## 2021-11-26 DIAGNOSIS — O09521 Supervision of elderly multigravida, first trimester: Secondary | ICD-10-CM

## 2021-11-26 DIAGNOSIS — O30041 Twin pregnancy, dichorionic/diamniotic, first trimester: Secondary | ICD-10-CM | POA: Insufficient documentation

## 2021-11-26 DIAGNOSIS — Z3A18 18 weeks gestation of pregnancy: Secondary | ICD-10-CM

## 2021-12-15 ENCOUNTER — Ambulatory Visit (INDEPENDENT_AMBULATORY_CARE_PROVIDER_SITE_OTHER): Payer: Managed Care, Other (non HMO) | Admitting: Family Medicine

## 2021-12-15 VITALS — BP 121/65 | HR 73 | Wt 216.0 lb

## 2021-12-15 DIAGNOSIS — O09529 Supervision of elderly multigravida, unspecified trimester: Secondary | ICD-10-CM | POA: Insufficient documentation

## 2021-12-15 DIAGNOSIS — Z3A21 21 weeks gestation of pregnancy: Secondary | ICD-10-CM

## 2021-12-15 DIAGNOSIS — O30042 Twin pregnancy, dichorionic/diamniotic, second trimester: Secondary | ICD-10-CM

## 2021-12-15 DIAGNOSIS — Z3480 Encounter for supervision of other normal pregnancy, unspecified trimester: Secondary | ICD-10-CM

## 2021-12-15 NOTE — Progress Notes (Signed)
   PRENATAL VISIT NOTE  Subjective:  Erica Shelton is a 36 y.o. G2P1001 at [redacted]w[redacted]d being seen today for ongoing prenatal care.  She is currently monitored for the following issues for this high-risk pregnancy and has Annual physical exam; Supervision of other normal pregnancy, antepartum; Dichorionic diamniotic twin pregnancy in second trimester; and Antepartum multigravida of advanced maternal age on their problem list.  Patient reports no complaints.  Contractions: Not present. Vag. Bleeding: None.  Movement: Present. Denies leaking of fluid.   The following portions of the patient's history were reviewed and updated as appropriate: allergies, current medications, past family history, past medical history, past social history, past surgical history and problem list.   Objective:   Vitals:   12/15/21 1057  BP: 121/65  Pulse: 73  Weight: 216 lb (98 kg)    Fetal Status: Fetal Heart Rate (bpm): 135/150   Movement: Present     General:  Alert, oriented and cooperative. Patient is in no acute distress.  Skin: Skin is warm and dry. No rash noted.   Cardiovascular: Normal heart rate noted  Respiratory: Normal respiratory effort, no problems with respiration noted  Abdomen: Soft, gravid, appropriate for gestational age.  Pain/Pressure: Absent     Pelvic: Cervical exam deferred        Extremities: Normal range of motion.  Edema: None  Mental Status: Normal mood and affect. Normal behavior. Normal judgment and thought content.   Assessment and Plan:  Pregnancy: G2P1001 at [redacted]w[redacted]d 1. [redacted] weeks gestation of pregnancy  2. Supervision of other normal pregnancy, antepartum FHT and FH normal  3. Dichorionic diamniotic twin pregnancy in second trimester Growth normal - 78% Concordant growth - 2%  4. Antepartum multigravida of advanced maternal age ASA 81mg   Preterm labor symptoms and general obstetric precautions including but not limited to vaginal bleeding, contractions, leaking of fluid  and fetal movement were reviewed in detail with the patient. Please refer to After Visit Summary for other counseling recommendations.   No follow-ups on file.  Future Appointments  Date Time Provider Department Center  12/24/2021  7:30 AM [facility] NURSE [facility]  12/24/2021  7:45 AM [facility] US4 [facility]  01/12/2022  4:10 PM 01/14/2022, DO [facility] None  02/02/2022  8:15 AM 02/04/2022 Adrian Blackwater, DO [facility] None    Rhona Raider, DO

## 2021-12-24 ENCOUNTER — Ambulatory Visit: Payer: Managed Care, Other (non HMO) | Admitting: *Deleted

## 2021-12-24 ENCOUNTER — Other Ambulatory Visit: Payer: Self-pay | Admitting: *Deleted

## 2021-12-24 ENCOUNTER — Ambulatory Visit: Payer: Managed Care, Other (non HMO) | Attending: Obstetrics and Gynecology

## 2021-12-24 VITALS — BP 119/61 | HR 75

## 2021-12-24 DIAGNOSIS — O30042 Twin pregnancy, dichorionic/diamniotic, second trimester: Secondary | ICD-10-CM

## 2021-12-24 DIAGNOSIS — Z362 Encounter for other antenatal screening follow-up: Secondary | ICD-10-CM | POA: Diagnosis present

## 2021-12-24 DIAGNOSIS — O09522 Supervision of elderly multigravida, second trimester: Secondary | ICD-10-CM | POA: Diagnosis not present

## 2021-12-24 DIAGNOSIS — Z3A22 22 weeks gestation of pregnancy: Secondary | ICD-10-CM | POA: Diagnosis not present

## 2022-01-12 ENCOUNTER — Ambulatory Visit (INDEPENDENT_AMBULATORY_CARE_PROVIDER_SITE_OTHER): Payer: Managed Care, Other (non HMO) | Admitting: Family Medicine

## 2022-01-12 VITALS — BP 109/61 | HR 70 | Wt 224.0 lb

## 2022-01-12 DIAGNOSIS — O09522 Supervision of elderly multigravida, second trimester: Secondary | ICD-10-CM

## 2022-01-12 DIAGNOSIS — O30042 Twin pregnancy, dichorionic/diamniotic, second trimester: Secondary | ICD-10-CM

## 2022-01-12 DIAGNOSIS — O09529 Supervision of elderly multigravida, unspecified trimester: Secondary | ICD-10-CM

## 2022-01-12 DIAGNOSIS — Z3482 Encounter for supervision of other normal pregnancy, second trimester: Secondary | ICD-10-CM

## 2022-01-12 DIAGNOSIS — Z3A25 25 weeks gestation of pregnancy: Secondary | ICD-10-CM

## 2022-01-12 DIAGNOSIS — Z3480 Encounter for supervision of other normal pregnancy, unspecified trimester: Secondary | ICD-10-CM

## 2022-01-12 NOTE — Progress Notes (Signed)
   PRENATAL VISIT NOTE  Subjective:  Erica Shelton is a 36 y.o. G2P1001 at [redacted]w[redacted]d being seen today for ongoing prenatal care.  She is currently monitored for the following issues for this high-risk pregnancy and has Annual physical exam; Supervision of other normal pregnancy, antepartum; Dichorionic diamniotic twin pregnancy in second trimester; and Antepartum multigravida of advanced maternal age on their problem list.  Patient reports no complaints.  Contractions: Not present. Vag. Bleeding: None.  Movement: Present. Denies leaking of fluid.   The following portions of the patient's history were reviewed and updated as appropriate: allergies, current medications, past family history, past medical history, past social history, past surgical history and problem list.   Objective:   Vitals:   01/12/22 1608  BP: 109/61  Pulse: 70  Weight: 224 lb (101.6 kg)    Fetal Status: Fetal Heart Rate (bpm): 134/132   Movement: Present     General:  Alert, oriented and cooperative. Patient is in no acute distress.  Skin: Skin is warm and dry. No rash noted.   Cardiovascular: Normal heart rate noted  Respiratory: Normal respiratory effort, no problems with respiration noted  Abdomen: Soft, gravid, appropriate for gestational age.  Pain/Pressure: Absent     Pelvic: Cervical exam deferred        Extremities: Normal range of motion.  Edema: None  Mental Status: Normal mood and affect. Normal behavior. Normal judgment and thought content.   Assessment and Plan:  Pregnancy: G2P1001 at [redacted]w[redacted]d 1. Supervision of other normal pregnancy, antepartum FHT and FH appropriate  2. Antepartum multigravida of advanced maternal age On ASA 81mg   3. Dichorionic diamniotic twin pregnancy in second trimester 8/11 - EFW 58%/79% - 7% discordance. Breech/Vtx position on 10/11 today. Appetite normal. Has f/u US next week.  Preterm labor symptoms and general obstetric precautions including but not limited to vaginal  bleeding, contractions, leaking of fluid and fetal movement were reviewed in detail with the patient. Please refer to After Visit Summary for other counseling recommendations.   No follow-ups on file.  Future Appointments  Date Time Provider Department Center  01/21/2022  7:30 AM [facility] NURSE [facility]  01/21/2022  7:45 AM [facility] US5 [facility]  02/02/2022  8:15 AM 02/04/2022, DO [facility] None  02/16/2022  9:55 AM 04/18/2022, DO [facility] None  03/02/2022  9:35 AM 03/04/2022, DO [facility] None  03/16/2022  9:35 AM 13/05/2021, DO [facility] None  03/30/2022  8:35 AM 04/01/2022, DO [facility] None  04/06/2022  8:35 AM 04/08/2022, MD [facility] None  04/14/2022  8:35 AM 04/16/2022, DO [facility] None    Levie Heritage, DO

## 2022-01-21 ENCOUNTER — Other Ambulatory Visit: Payer: Self-pay | Admitting: *Deleted

## 2022-01-21 ENCOUNTER — Ambulatory Visit: Payer: Managed Care, Other (non HMO) | Attending: Obstetrics and Gynecology

## 2022-01-21 ENCOUNTER — Encounter: Payer: Self-pay | Admitting: *Deleted

## 2022-01-21 ENCOUNTER — Ambulatory Visit: Payer: Managed Care, Other (non HMO) | Admitting: *Deleted

## 2022-01-21 VITALS — BP 119/68 | HR 69

## 2022-01-21 DIAGNOSIS — O30042 Twin pregnancy, dichorionic/diamniotic, second trimester: Secondary | ICD-10-CM | POA: Diagnosis not present

## 2022-01-21 DIAGNOSIS — O09522 Supervision of elderly multigravida, second trimester: Secondary | ICD-10-CM | POA: Diagnosis present

## 2022-01-21 DIAGNOSIS — O09523 Supervision of elderly multigravida, third trimester: Secondary | ICD-10-CM

## 2022-01-21 DIAGNOSIS — Z3A26 26 weeks gestation of pregnancy: Secondary | ICD-10-CM | POA: Insufficient documentation

## 2022-01-21 DIAGNOSIS — Z3480 Encounter for supervision of other normal pregnancy, unspecified trimester: Secondary | ICD-10-CM

## 2022-01-21 DIAGNOSIS — O30043 Twin pregnancy, dichorionic/diamniotic, third trimester: Secondary | ICD-10-CM

## 2022-02-02 ENCOUNTER — Ambulatory Visit (INDEPENDENT_AMBULATORY_CARE_PROVIDER_SITE_OTHER): Payer: Managed Care, Other (non HMO) | Admitting: Family Medicine

## 2022-02-02 VITALS — BP 111/65 | HR 73 | Wt 223.0 lb

## 2022-02-02 DIAGNOSIS — Z23 Encounter for immunization: Secondary | ICD-10-CM | POA: Diagnosis not present

## 2022-02-02 DIAGNOSIS — O30042 Twin pregnancy, dichorionic/diamniotic, second trimester: Secondary | ICD-10-CM

## 2022-02-02 DIAGNOSIS — Z3A28 28 weeks gestation of pregnancy: Secondary | ICD-10-CM

## 2022-02-02 DIAGNOSIS — O09529 Supervision of elderly multigravida, unspecified trimester: Secondary | ICD-10-CM

## 2022-02-02 DIAGNOSIS — Z3480 Encounter for supervision of other normal pregnancy, unspecified trimester: Secondary | ICD-10-CM

## 2022-02-02 DIAGNOSIS — O09522 Supervision of elderly multigravida, second trimester: Secondary | ICD-10-CM

## 2022-02-02 NOTE — Progress Notes (Signed)
   PRENATAL VISIT NOTE  Subjective:  Erica Shelton is a 36 y.o. G2P1001 at [redacted]w[redacted]d being seen today for ongoing prenatal care.  She is currently monitored for the following issues for this high-risk pregnancy and has Annual physical exam; Supervision of other normal pregnancy, antepartum; Dichorionic diamniotic twin pregnancy in second trimester; and Antepartum multigravida of advanced maternal age on their problem list.  Patient reports no complaints.  Contractions: Irritability. Vag. Bleeding: None.  Movement: Present. Denies leaking of fluid.   The following portions of the patient's history were reviewed and updated as appropriate: allergies, current medications, past family history, past medical history, past social history, past surgical history and problem list.   Objective:   Vitals:   02/02/22 0819  BP: 111/65  Pulse: 73  Weight: 223 lb (101.2 kg)    Fetal Status: Fetal Heart Rate (bpm): 117/127   Movement: Present     General:  Alert, oriented and cooperative. Patient is in no acute distress.  Skin: Skin is warm and dry. No rash noted.   Cardiovascular: Normal heart rate noted  Respiratory: Normal respiratory effort, no problems with respiration noted  Abdomen: Soft, gravid, appropriate for gestational age.  Pain/Pressure: Absent     Pelvic: Cervical exam deferred        Extremities: Normal range of motion.  Edema: None  Mental Status: Normal mood and affect. Normal behavior. Normal judgment and thought content.   Assessment and Plan:  Pregnancy: G2P1001 at [redacted]w[redacted]d 1. [redacted] weeks gestation of pregnancy - CBC - Glucose Tolerance, 2 Hours w/1 Hour - HIV Antibody (routine testing w rflx) - RPR - Tdap vaccine greater than or equal to 7yo IM  2. Supervision of other normal pregnancy, antepartum FHT and FH normal Plans on BTL. Has private insurance.  3. Dichorionic diamniotic twin pregnancy in second trimester [place] 9/9: vtx/vtx.EFW 46%/78% with 10% discordance.  4.  Antepartum multigravida of advanced maternal age On asa 81mg    Preterm labor symptoms and general obstetric precautions including but not limited to vaginal bleeding, contractions, leaking of fluid and fetal movement were reviewed in detail with the patient. Please refer to After Visit Summary for other counseling recommendations.   No follow-ups on file.  Future Appointments  Date Time Provider [place]  02/16/2022  9:55 AM Truett Mainland, DO [facility] None  02/18/2022  7:30 AM [facility] NURSE [facility]  02/18/2022  7:45 AM [facility] US4 [facility]  03/02/2022  9:35 AM Nehemiah Settle, Tanna Savoy, DO [facility] None  03/16/2022  9:35 AM Truett Mainland, DO [facility] None  03/30/2022  8:35 AM Truett Mainland, DO [facility] None  04/06/2022  8:35 AM Darliss Cheney, MD [facility] None  04/14/2022  8:35 AM Truett Mainland, DO [facility] None    Truett Mainland, DO

## 2022-02-03 LAB — CBC
Hematocrit: 30.3 % — ABNORMAL LOW (ref 34.0–46.6)
Hemoglobin: 9.9 g/dL — ABNORMAL LOW (ref 11.1–15.9)
MCH: 29 pg (ref 26.6–33.0)
MCHC: 32.7 g/dL (ref 31.5–35.7)
MCV: 89 fL (ref 79–97)
Platelets: 282 10*3/uL (ref 150–450)
RBC: 3.41 x10E6/uL — ABNORMAL LOW (ref 3.77–5.28)
RDW: 11.8 % (ref 11.7–15.4)
WBC: 9.1 10*3/uL (ref 3.4–10.8)

## 2022-02-03 LAB — HIV ANTIBODY (ROUTINE TESTING W REFLEX): HIV Screen 4th Generation wRfx: NONREACTIVE

## 2022-02-03 LAB — RPR: RPR Ser Ql: NONREACTIVE

## 2022-02-03 LAB — GLUCOSE TOLERANCE, 2 HOURS W/ 1HR
Glucose, 1 hour: 115 mg/dL (ref 70–179)
Glucose, 2 hour: 110 mg/dL (ref 70–152)
Glucose, Fasting: 76 mg/dL (ref 70–91)

## 2022-02-16 ENCOUNTER — Ambulatory Visit (INDEPENDENT_AMBULATORY_CARE_PROVIDER_SITE_OTHER): Payer: Managed Care, Other (non HMO) | Admitting: Family Medicine

## 2022-02-16 VITALS — BP 106/63 | HR 85 | Wt 229.0 lb

## 2022-02-16 DIAGNOSIS — O09529 Supervision of elderly multigravida, unspecified trimester: Secondary | ICD-10-CM

## 2022-02-16 DIAGNOSIS — Z3480 Encounter for supervision of other normal pregnancy, unspecified trimester: Secondary | ICD-10-CM

## 2022-02-16 DIAGNOSIS — O30042 Twin pregnancy, dichorionic/diamniotic, second trimester: Secondary | ICD-10-CM

## 2022-02-16 DIAGNOSIS — Z3A30 30 weeks gestation of pregnancy: Secondary | ICD-10-CM

## 2022-02-16 DIAGNOSIS — O99013 Anemia complicating pregnancy, third trimester: Secondary | ICD-10-CM | POA: Insufficient documentation

## 2022-02-16 NOTE — Progress Notes (Signed)
   PRENATAL VISIT NOTE  Subjective:  Erica Shelton is a 36 y.o. G2P1001 at [redacted]w[redacted]d being seen today for ongoing prenatal care.  She is currently monitored for the following issues for this high-risk pregnancy and has Supervision of other normal pregnancy, antepartum; Dichorionic diamniotic twin pregnancy in second trimester; Antepartum multigravida of advanced maternal age; and Anemia in pregnancy, third trimester on their problem list.  Patient reports no complaints.  Contractions: Not present. Vag. Bleeding: None.  Movement: Present. Denies leaking of fluid.   The following portions of the patient's history were reviewed and updated as appropriate: allergies, current medications, past family history, past medical history, past social history, past surgical history and problem list.   Objective:   Vitals:   02/16/22 0955  BP: 106/63  Pulse: 85  Weight: 229 lb (103.9 kg)    Fetal Status: Fetal Heart Rate (bpm): 119/129   Movement: Present     General:  Alert, oriented and cooperative. Patient is in no acute distress.  Skin: Skin is warm and dry. No rash noted.   Cardiovascular: Normal heart rate noted  Respiratory: Normal respiratory effort, no problems with respiration noted  Abdomen: Soft, gravid, appropriate for gestational age.  Pain/Pressure: Absent     Pelvic: Cervical exam deferred        Extremities: Normal range of motion.  Edema: Trace  Mental Status: Normal mood and affect. Normal behavior. Normal judgment and thought content.   Assessment and Plan:  Pregnancy: G2P1001 at [redacted]w[redacted]d 1. [redacted] weeks gestation of pregnancy  2. Supervision of other normal pregnancy, antepartum FHT and FH normal  3. Dichorionic diamniotic twin pregnancy in second trimester [place] 9/9: vtx/vtx.EFW 46%/78% with 10% discordance. [place] on Friday. Antenatal testing at 35 weeks  4. Antepartum multigravida of advanced maternal age ASA 81mg   5. Anemia in pregnancy, third trimester On iron  Preterm labor  symptoms and general obstetric precautions including but not limited to vaginal bleeding, contractions, leaking of fluid and fetal movement were reviewed in detail with the patient. Please refer to After Visit Summary for other counseling recommendations.   No follow-ups on file.  Future Appointments  Date Time Provider [place]  02/18/2022  7:30 AM [facility] NURSE [facility]  02/18/2022  7:45 AM [facility] US4 [facility]  03/02/2022  9:35 AM Truett Mainland, DO [facility] None  03/16/2022  9:35 AM Truett Mainland, DO [facility] None  03/30/2022  8:35 AM Truett Mainland, DO [facility] None  04/06/2022  8:35 AM Darliss Cheney, MD [facility] None  04/14/2022  8:35 AM Truett Mainland, DO [facility] None    Truett Mainland, DO

## 2022-02-18 ENCOUNTER — Ambulatory Visit: Payer: Managed Care, Other (non HMO) | Admitting: *Deleted

## 2022-02-18 ENCOUNTER — Ambulatory Visit: Payer: Managed Care, Other (non HMO) | Attending: Obstetrics and Gynecology

## 2022-02-18 ENCOUNTER — Encounter: Payer: Self-pay | Admitting: *Deleted

## 2022-02-18 ENCOUNTER — Other Ambulatory Visit: Payer: Self-pay | Admitting: *Deleted

## 2022-02-18 VITALS — BP 125/67 | HR 84

## 2022-02-18 DIAGNOSIS — Z3A30 30 weeks gestation of pregnancy: Secondary | ICD-10-CM | POA: Insufficient documentation

## 2022-02-18 DIAGNOSIS — Z3480 Encounter for supervision of other normal pregnancy, unspecified trimester: Secondary | ICD-10-CM

## 2022-02-18 DIAGNOSIS — O09523 Supervision of elderly multigravida, third trimester: Secondary | ICD-10-CM | POA: Insufficient documentation

## 2022-02-18 DIAGNOSIS — O30043 Twin pregnancy, dichorionic/diamniotic, third trimester: Secondary | ICD-10-CM | POA: Insufficient documentation

## 2022-03-02 ENCOUNTER — Ambulatory Visit (INDEPENDENT_AMBULATORY_CARE_PROVIDER_SITE_OTHER): Payer: Managed Care, Other (non HMO) | Admitting: Family Medicine

## 2022-03-02 VITALS — BP 114/63 | HR 80 | Wt 229.0 lb

## 2022-03-02 DIAGNOSIS — Z3A32 32 weeks gestation of pregnancy: Secondary | ICD-10-CM

## 2022-03-02 DIAGNOSIS — O09523 Supervision of elderly multigravida, third trimester: Secondary | ICD-10-CM

## 2022-03-02 DIAGNOSIS — O09529 Supervision of elderly multigravida, unspecified trimester: Secondary | ICD-10-CM

## 2022-03-02 DIAGNOSIS — O30043 Twin pregnancy, dichorionic/diamniotic, third trimester: Secondary | ICD-10-CM

## 2022-03-02 DIAGNOSIS — O30042 Twin pregnancy, dichorionic/diamniotic, second trimester: Secondary | ICD-10-CM

## 2022-03-02 DIAGNOSIS — Z3480 Encounter for supervision of other normal pregnancy, unspecified trimester: Secondary | ICD-10-CM

## 2022-03-02 DIAGNOSIS — O99013 Anemia complicating pregnancy, third trimester: Secondary | ICD-10-CM

## 2022-03-02 DIAGNOSIS — Z3483 Encounter for supervision of other normal pregnancy, third trimester: Secondary | ICD-10-CM

## 2022-03-02 NOTE — Progress Notes (Signed)
   PRENATAL VISIT NOTE  Subjective:  Erica Shelton is a 36 y.o. G2P1001 at [redacted]w[redacted]d being seen today for ongoing prenatal care.  She is currently monitored for the following issues for this high-risk pregnancy and has Supervision of other normal pregnancy, antepartum; Dichorionic diamniotic twin pregnancy in second trimester; Antepartum multigravida of advanced maternal age; and Anemia in pregnancy, third trimester on their problem list.  Patient reports occasional contractions.  Contractions: Irritability. Vag. Bleeding: None.  Movement: Present. Denies leaking of fluid.   The following portions of the patient's history were reviewed and updated as appropriate: allergies, current medications, past family history, past medical history, past social history, past surgical history and problem list.   Objective:   Vitals:   03/02/22 0930  BP: 114/63  Pulse: 80  Weight: 229 lb (103.9 kg)    Fetal Status: Fetal Heart Rate (bpm): 128/136   Movement: Present     General:  Alert, oriented and cooperative. Patient is in no acute distress.  Skin: Skin is warm and dry. No rash noted.   Cardiovascular: Normal heart rate noted  Respiratory: Normal respiratory effort, no problems with respiration noted  Abdomen: Soft, gravid, appropriate for gestational age.  Pain/Pressure: Present     Pelvic: Cervical exam deferred        Extremities: Normal range of motion.  Edema: None  Mental Status: Normal mood and affect. Normal behavior. Normal judgment and thought content.   Assessment and Plan:  Pregnancy: G2P1001 at [redacted]w[redacted]d 1. [redacted] weeks gestation of pregnancy  2. Supervision of other normal pregnancy, antepartum FHT normal  3. Antepartum multigravida of advanced maternal age ASA 81mg   4. Dichorionic diamniotic twin pregnancy in second trimester Normal growth: 57%/44% Next [place] on 11/3. Antenatal testing at 36 weeks Delivery between 37-38 weeks  5. Anemia in pregnancy, third trimester On  iron  Preterm labor symptoms and general obstetric precautions including but not limited to vaginal bleeding, contractions, leaking of fluid and fetal movement were reviewed in detail with the patient. Please refer to After Visit Summary for other counseling recommendations.   No follow-ups on file.  Future Appointments  Date Time Provider [place]  03/16/2022  9:35 AM Truett Mainland, DO [facility] None  03/18/2022  8:15 AM [facility] NURSE [facility]  03/18/2022  8:30 AM [facility] US3 [facility]  03/25/2022  8:45 AM [facility] NURSE [facility]  03/25/2022  9:00 AM [facility] US1 [facility]  03/30/2022  8:35 AM Nehemiah Settle, Tanna Savoy, DO [facility] None  04/01/2022  7:30 AM [facility] NURSE [facility]  04/01/2022  7:45 AM [facility] US5 [facility]  04/06/2022  8:35 AM Darliss Cheney, MD [facility] None  04/06/2022  9:45 AM [facility] NURSE [facility]  04/06/2022 10:00 AM [facility] US1 [facility]  04/14/2022  8:35 AM Truett Mainland, DO [facility] None  04/15/2022  7:30 AM [facility] NURSE [facility]  04/15/2022  7:45 AM [facility] US5 W[facility] [place], DO

## 2022-03-16 ENCOUNTER — Ambulatory Visit: Payer: Managed Care, Other (non HMO) | Admitting: Family Medicine

## 2022-03-16 VITALS — BP 123/67 | HR 82 | Wt 231.0 lb

## 2022-03-16 DIAGNOSIS — Z3480 Encounter for supervision of other normal pregnancy, unspecified trimester: Secondary | ICD-10-CM

## 2022-03-16 DIAGNOSIS — O09529 Supervision of elderly multigravida, unspecified trimester: Secondary | ICD-10-CM

## 2022-03-16 DIAGNOSIS — O30042 Twin pregnancy, dichorionic/diamniotic, second trimester: Secondary | ICD-10-CM

## 2022-03-16 DIAGNOSIS — R059 Cough, unspecified: Secondary | ICD-10-CM

## 2022-03-16 DIAGNOSIS — O99013 Anemia complicating pregnancy, third trimester: Secondary | ICD-10-CM

## 2022-03-16 DIAGNOSIS — Z3A34 34 weeks gestation of pregnancy: Secondary | ICD-10-CM

## 2022-03-16 MED ORDER — ALBUTEROL SULFATE HFA 108 (90 BASE) MCG/ACT IN AERS: 2.0000 | INHALATION_SPRAY | Freq: Four times a day (QID) | RESPIRATORY_TRACT | 2 refills | Status: DC | PRN

## 2022-03-16 NOTE — Progress Notes (Signed)
   PRENATAL VISIT NOTE  Subjective:  Erica Shelton is a 36 y.o. G2P1001 at [redacted]w[redacted]d being seen today for ongoing prenatal care.  She is currently monitored for the following issues for this high-risk pregnancy and has Supervision of other normal pregnancy, antepartum; Dichorionic diamniotic twin pregnancy in second trimester; Antepartum multigravida of advanced maternal age; and Anemia in pregnancy, third trimester on their problem list.  Patient reports  cough, wheezing - worse at night. Has history of asthma as child .  Contractions: Not present. Vag. Bleeding: None.  Movement: Present. Denies leaking of fluid.   The following portions of the patient's history were reviewed and updated as appropriate: allergies, current medications, past family history, past medical history, past social history, past surgical history and problem list.   Objective:   Vitals:   03/16/22 0923  BP: 123/67  Pulse: 82  Weight: 231 lb (104.8 kg)    Fetal Status: Fetal Heart Rate (bpm): 130/135   Movement: Present     General:  Alert, oriented and cooperative. Patient is in no acute distress.  Skin: Skin is warm and dry. No rash noted.   Cardiovascular: Normal heart rate noted  Respiratory: Normal respiratory effort, no problems with respiration noted  Abdomen: Soft, gravid, appropriate for gestational age.  Pain/Pressure: Present     Pelvic: Cervical exam deferred        Extremities: Normal range of motion.  Edema: None  Mental Status: Normal mood and affect. Normal behavior. Normal judgment and thought content.   Assessment and Plan:  Pregnancy: G2P1001 at [redacted]w[redacted]d 1. Supervision of other normal pregnancy, antepartum  2. Dichorionic diamniotic twin pregnancy in second trimester Normal growth.  Has rpt [place] on Friday.  Antenatal testing next week.  3. Antepartum multigravida of advanced maternal age ASA 81mg   4. Anemia in pregnancy, third trimester On iron  5. Cough, unspecified type Will send  albuterol HFA to pharmacy. If improves symptoms, may need LABA if using several times a day.   Preterm labor symptoms and general obstetric precautions including but not limited to vaginal bleeding, contractions, leaking of fluid and fetal movement were reviewed in detail with the patient. Please refer to After Visit Summary for other counseling recommendations.   No follow-ups on file.  Future Appointments  Date Time Provider [place]  03/18/2022  8:15 AM [facility] NURSE [facility]  03/18/2022  8:30 AM [facility] US3 [facility]  03/25/2022  8:45 AM [facility] NURSE [facility]  03/25/2022  9:00 AM [facility] US1 [facility]  03/30/2022  8:35 AM Debroah Shuttleworth, Tanna Savoy, DO [facility] None  04/01/2022  7:30 AM [facility] NURSE [facility]  04/01/2022  7:45 AM [facility] US5 [facility]  04/06/2022  8:35 AM Darliss Cheney, MD [facility] None  04/06/2022  9:45 AM [facility] NURSE [facility]  04/06/2022 10:00 AM [facility] US1 [facility]  04/14/2022  8:35 AM Truett Mainland, DO [facility] None  04/15/2022  7:30 AM [facility] NURSE [facility]  04/15/2022  7:45 AM [facility] US5 W[facility]US [place], DO

## 2022-03-18 ENCOUNTER — Ambulatory Visit: Payer: Managed Care, Other (non HMO) | Attending: Obstetrics | Admitting: *Deleted

## 2022-03-18 ENCOUNTER — Ambulatory Visit: Payer: Managed Care, Other (non HMO)

## 2022-03-18 VITALS — BP 118/62 | HR 82

## 2022-03-18 DIAGNOSIS — Z3A34 34 weeks gestation of pregnancy: Secondary | ICD-10-CM | POA: Diagnosis not present

## 2022-03-18 DIAGNOSIS — O09523 Supervision of elderly multigravida, third trimester: Secondary | ICD-10-CM | POA: Insufficient documentation

## 2022-03-18 DIAGNOSIS — O30043 Twin pregnancy, dichorionic/diamniotic, third trimester: Secondary | ICD-10-CM | POA: Diagnosis present

## 2022-03-18 DIAGNOSIS — Z362 Encounter for other antenatal screening follow-up: Secondary | ICD-10-CM | POA: Insufficient documentation

## 2022-03-18 DIAGNOSIS — Z3689 Encounter for other specified antenatal screening: Secondary | ICD-10-CM

## 2022-03-18 DIAGNOSIS — Z3A35 35 weeks gestation of pregnancy: Secondary | ICD-10-CM | POA: Diagnosis not present

## 2022-03-19 ENCOUNTER — Telehealth: Payer: Managed Care, Other (non HMO) | Admitting: Nurse Practitioner

## 2022-03-19 DIAGNOSIS — B338 Other specified viral diseases: Secondary | ICD-10-CM | POA: Diagnosis not present

## 2022-03-19 NOTE — Progress Notes (Signed)
Virtual Visit Consent   Erica Shelton, you are scheduled for a virtual visit with Mary-Margaret Daphine Deutscher, FNP, a [facility] provider, today.     Just as with appointments in the office, your consent must be obtained to participate.  Your consent will be active for this visit and any virtual visit you may have with one of our providers in the next 365 days.     If you have a MyChart account, a copy of this consent can be sent to you electronically.  All virtual visits are billed to your insurance company just like a traditional visit in the office.    As this is a virtual visit, video technology does not allow for your provider to perform a traditional examination.  This may limit your provider's ability to fully assess your condition.  If your provider identifies any concerns that need to be evaluated in person or the need to arrange testing (such as labs, EKG, etc.), we will make arrangements to do so.     Although advances in technology are sophisticated, we cannot ensure that it will always work on either your end or our end.  If the connection with a video visit is poor, the visit may have to be switched to a telephone visit.  With either a video or telephone visit, we are not always able to ensure that we have a secure connection.     I need to obtain your verbal consent now.   Are you willing to proceed with your visit today? YES   Erica Shelton has provided verbal consent on 03/19/2022 for a virtual visit (video or telephone).   Mary-Margaret Daphine Deutscher, FNP   Date: 03/19/2022 5:26 PM   Virtual Visit via Video Note   I, Mary-Margaret Daphine Deutscher, connected with Erica Shelton ([id number], [birth year]) on 03/19/22 at  5:30 PM EDT by a video-enabled telemedicine application and verified that I am speaking with the correct person using two identifiers.  Location: Patient: Virtual Visit Location Patient: Home Provider: Virtual Visit Location Provider: Mobile   I discussed the limitations of  evaluation and management by telemedicine and the availability of in person appointments. The patient expressed understanding and agreed to proceed.    History of Present Illness: Erica Shelton is a 36 y.o. who identifies as a female who was assigned female at birth, and is being seen today for RSV .  HPI: Patient  is 35 weeks present. Hr kids have RSV.  URI  This is a new problem. The current episode started in the past 7 days. The problem has been gradually worsening. The maximum temperature recorded prior to her arrival was 100.4 - 100.9 F. Associated symptoms include congestion, coughing and rhinorrhea. She has tried acetaminophen for the symptoms. The treatment provided mild relief.    Review of Systems  HENT:  Positive for congestion and rhinorrhea.   Respiratory:  Positive for cough.     Problems:  Patient Active Problem List   Diagnosis Date Noted   Anemia in pregnancy, third trimester 02/16/2022   Antepartum multigravida of advanced maternal age 40/06/2021   Supervision of other normal pregnancy, antepartum 09/24/2021   Dichorionic diamniotic twin pregnancy in second trimester 09/24/2021    Allergies: No Known Allergies Medications:  Current Outpatient Medications:    albuterol (VENTOLIN HFA) 108 (90 Base) MCG/ACT inhaler, Inhale 2 puffs into the lungs every 6 (six) hours as needed for wheezing or shortness of breath., Disp: 8 g, Rfl: 2  aspirin EC 81 MG tablet, Take 81 mg by mouth daily. Swallow whole., Disp: , Rfl:    ferrous sulfate 325 (65 FE) MG tablet, Take 325 mg by mouth daily with breakfast. Patient is taking 1 tablet every other day., Disp: , Rfl:    loratadine (CLARITIN) 10 MG tablet, Take 10 mg by mouth daily., Disp: , Rfl:    Prenatal Vit-Fe Fumarate-FA (PRENATAL VITAMIN PO), Take by mouth., Disp: , Rfl:   Observations/Objective: Patient is well-developed, well-nourished in no acute distress.  Resting comfortably  at home.  Head is normocephalic,  atraumatic.  No labored breathing.  Speech is clear and coherent with logical content.  Patient is alert and oriented at baseline.  Raspy voice Wet cough  Assessment and Plan:  Erica Shelton in today with chief complaint of RSV   1. RSV (respiratory syncytial virus infection) 1. Take meds as prescribed 2. Use a cool mist humidifier especially during the winter months and when heat has been humid. 3. Use saline nose sprays frequently 4. Saline irrigations of the nose can be very helpful if done frequently.  * 4X daily for 1 week*  * Use of a nettie pot can be helpful with this. Follow directions with this* 5. Drink plenty of fluids 6. Keep thermostat turn down low 7.For any cough or congestion- robitussin  8. For fever or aces or pains- take tylenol or ibuprofen appropriate for age and weight.  * for fevers greater than 101 orally you may alternate ibuprofen and tylenol every  3 hours.   If become SOB will need face to face visit   Follow Up Instructions: I discussed the assessment and treatment plan with the patient. The patient was provided an opportunity to ask questions and all were answered. The patient agreed with the plan and demonstrated an understanding of the instructions.  A copy of instructions were sent to the patient via MyChart.  The patient was advised to call back or seek an in-person evaluation if the symptoms worsen or if the condition fails to improve as anticipated.  Time:  I spent 6 minutes with the patient via telehealth technology discussing the above problems/concerns.    Mary-Margaret Hassell Done, FNP

## 2022-03-25 ENCOUNTER — Ambulatory Visit: Payer: Managed Care, Other (non HMO)

## 2022-03-25 ENCOUNTER — Ambulatory Visit: Payer: Managed Care, Other (non HMO) | Admitting: *Deleted

## 2022-03-25 ENCOUNTER — Ambulatory Visit: Payer: Managed Care, Other (non HMO) | Attending: Obstetrics

## 2022-03-25 VITALS — BP 118/67 | HR 86

## 2022-03-25 DIAGNOSIS — O09523 Supervision of elderly multigravida, third trimester: Secondary | ICD-10-CM

## 2022-03-25 DIAGNOSIS — O30043 Twin pregnancy, dichorionic/diamniotic, third trimester: Secondary | ICD-10-CM

## 2022-03-25 DIAGNOSIS — Z3A35 35 weeks gestation of pregnancy: Secondary | ICD-10-CM | POA: Diagnosis not present

## 2022-03-30 ENCOUNTER — Ambulatory Visit (INDEPENDENT_AMBULATORY_CARE_PROVIDER_SITE_OTHER): Payer: Managed Care, Other (non HMO) | Admitting: Family Medicine

## 2022-03-30 ENCOUNTER — Other Ambulatory Visit (HOSPITAL_COMMUNITY)
Admission: RE | Admit: 2022-03-30 | Discharge: 2022-03-30 | Disposition: A | Discharge status: Home / Self Care | Payer: Managed Care, Other (non HMO) | Source: Ambulatory Visit | Attending: Family Medicine | Admitting: Family Medicine

## 2022-03-30 VITALS — BP 121/74 | HR 84 | Wt 232.0 lb

## 2022-03-30 DIAGNOSIS — Z3480 Encounter for supervision of other normal pregnancy, unspecified trimester: Secondary | ICD-10-CM

## 2022-03-30 DIAGNOSIS — O09523 Supervision of elderly multigravida, third trimester: Secondary | ICD-10-CM

## 2022-03-30 DIAGNOSIS — O30042 Twin pregnancy, dichorionic/diamniotic, second trimester: Secondary | ICD-10-CM | POA: Insufficient documentation

## 2022-03-30 DIAGNOSIS — Z3A36 36 weeks gestation of pregnancy: Secondary | ICD-10-CM

## 2022-03-30 DIAGNOSIS — O30043 Twin pregnancy, dichorionic/diamniotic, third trimester: Secondary | ICD-10-CM

## 2022-03-30 DIAGNOSIS — Z3483 Encounter for supervision of other normal pregnancy, third trimester: Secondary | ICD-10-CM

## 2022-03-30 DIAGNOSIS — O09529 Supervision of elderly multigravida, unspecified trimester: Secondary | ICD-10-CM

## 2022-03-30 NOTE — Progress Notes (Signed)
   PRENATAL VISIT NOTE  Subjective:  Erica Shelton is a 36 y.o. G2P1001 at [redacted]w[redacted]d being seen today for ongoing prenatal care.  She is currently monitored for the following issues for this high-risk pregnancy and has Supervision of other normal pregnancy, antepartum; Dichorionic diamniotic twin pregnancy in second trimester; Antepartum multigravida of advanced maternal age; and Anemia in pregnancy, third trimester on their problem list.  Patient reports no complaints.  Contractions: Irritability. Vag. Bleeding: None.  Movement: Present. Denies leaking of fluid.   The following portions of the patient's history were reviewed and updated as appropriate: allergies, current medications, past family history, past medical history, past social history, past surgical history and problem list.   Objective:   Vitals:   03/30/22 0817  BP: 121/74  Pulse: 84  Weight: 232 lb (105.2 kg)    Fetal Status: Fetal Heart Rate (bpm): 135/142   Movement: Present     General:  Alert, oriented and cooperative. Patient is in no acute distress.  Skin: Skin is warm and dry. No rash noted.   Cardiovascular: Normal heart rate noted  Respiratory: Normal respiratory effort, no problems with respiration noted  Abdomen: Soft, gravid, appropriate for gestational age.  Pain/Pressure: Present     Pelvic: Cervical exam performed in the presence of a chaperone Dilation: 1.5 Effacement (%): 70 Station: -2  Extremities: Normal range of motion.  Edema: Mild pitting, slight indentation  Mental Status: Normal mood and affect. Normal behavior. Normal judgment and thought content.   Assessment and Plan:  Pregnancy: G2P1001 at [redacted]w[redacted]d 1. [redacted] weeks gestation of pregnancy - GC/Chlamydia probe amp ([place])not at [facility] - Culture, beta strep (group b only)  2. Supervision of other normal pregnancy, antepartum FHT normal BTL for contraception  3. Dichorionic diamniotic twin pregnancy in second trimester Good  growth Concordant BPP on Friday Induction between 38-39 weeks - will schedule induction on 11/28. - GC/Chlamydia probe amp ([place])not at [facility] - Culture, beta strep (group b only)  4. Antepartum multigravida of advanced maternal age ASA 81mg   Preterm labor symptoms and general obstetric precautions including but not limited to vaginal bleeding, contractions, leaking of fluid and fetal movement were reviewed in detail with the patient. Please refer to After Visit Summary for other counseling recommendations.   No follow-ups on file.  Future Appointments  Date Time Provider Department Center  04/01/2022  7:30 AM [facility] NURSE [facility]  04/01/2022  7:45 AM [facility] US5 [facility]  04/06/2022  8:35 AM 04/08/2022, MD [facility] None  04/14/2022  8:35 AM 04/16/2022, DO [facility] None    Levie Heritage, DO

## 2022-03-31 LAB — GC/CHLAMYDIA PROBE AMP (~~LOC~~) NOT AT ARMC
Chlamydia: NEGATIVE
Comment: NEGATIVE
Comment: NORMAL
Neisseria Gonorrhea: NEGATIVE

## 2022-04-01 ENCOUNTER — Ambulatory Visit: Payer: Managed Care, Other (non HMO) | Attending: Obstetrics and Gynecology

## 2022-04-01 ENCOUNTER — Ambulatory Visit: Payer: Managed Care, Other (non HMO) | Admitting: *Deleted

## 2022-04-01 VITALS — BP 125/69 | HR 93

## 2022-04-01 DIAGNOSIS — O30043 Twin pregnancy, dichorionic/diamniotic, third trimester: Secondary | ICD-10-CM

## 2022-04-01 DIAGNOSIS — O09523 Supervision of elderly multigravida, third trimester: Secondary | ICD-10-CM

## 2022-04-01 DIAGNOSIS — Z3A36 36 weeks gestation of pregnancy: Secondary | ICD-10-CM

## 2022-04-03 LAB — CULTURE, BETA STREP (GROUP B ONLY): Strep Gp B Culture: NEGATIVE

## 2022-04-05 ENCOUNTER — Ambulatory Visit: Payer: Managed Care, Other (non HMO) | Attending: Obstetrics | Admitting: *Deleted

## 2022-04-05 ENCOUNTER — Other Ambulatory Visit: Payer: Self-pay | Admitting: *Deleted

## 2022-04-05 ENCOUNTER — Encounter: Payer: Self-pay | Admitting: General Practice

## 2022-04-05 ENCOUNTER — Ambulatory Visit: Payer: Managed Care, Other (non HMO)

## 2022-04-05 VITALS — BP 128/75 | HR 71

## 2022-04-05 DIAGNOSIS — Z3A37 37 weeks gestation of pregnancy: Secondary | ICD-10-CM | POA: Diagnosis not present

## 2022-04-05 DIAGNOSIS — O30049 Twin pregnancy, dichorionic/diamniotic, unspecified trimester: Secondary | ICD-10-CM

## 2022-04-05 DIAGNOSIS — O09523 Supervision of elderly multigravida, third trimester: Secondary | ICD-10-CM | POA: Diagnosis not present

## 2022-04-05 DIAGNOSIS — O30043 Twin pregnancy, dichorionic/diamniotic, third trimester: Secondary | ICD-10-CM

## 2022-04-06 ENCOUNTER — Ambulatory Visit: Payer: Managed Care, Other (non HMO)

## 2022-04-06 ENCOUNTER — Ambulatory Visit: Payer: Managed Care, Other (non HMO) | Admitting: Obstetrics and Gynecology

## 2022-04-06 ENCOUNTER — Encounter: Payer: Self-pay | Admitting: Obstetrics and Gynecology

## 2022-04-06 ENCOUNTER — Telehealth (HOSPITAL_COMMUNITY): Payer: Self-pay | Admitting: *Deleted

## 2022-04-06 ENCOUNTER — Encounter (HOSPITAL_COMMUNITY): Payer: Self-pay | Admitting: *Deleted

## 2022-04-06 VITALS — BP 122/74 | HR 78 | Wt 230.0 lb

## 2022-04-06 DIAGNOSIS — Z3483 Encounter for supervision of other normal pregnancy, third trimester: Secondary | ICD-10-CM

## 2022-04-06 DIAGNOSIS — O30043 Twin pregnancy, dichorionic/diamniotic, third trimester: Secondary | ICD-10-CM

## 2022-04-06 DIAGNOSIS — R0981 Nasal congestion: Secondary | ICD-10-CM

## 2022-04-06 DIAGNOSIS — Z3480 Encounter for supervision of other normal pregnancy, unspecified trimester: Secondary | ICD-10-CM

## 2022-04-06 DIAGNOSIS — Z3A37 37 weeks gestation of pregnancy: Secondary | ICD-10-CM

## 2022-04-06 MED ORDER — MOMETASONE FUROATE 50 MCG/ACT NA SUSP: 2.0000 | Freq: Every day | NASAL | 3 refills | Status: DC

## 2022-04-06 NOTE — Progress Notes (Signed)
   PRENATAL VISIT NOTE  Subjective:  Erica Shelton is a 36 y.o. G2P1001 at [redacted]w[redacted]d being seen today for ongoing prenatal care.  She is currently monitored for the following issues for this high-risk pregnancy and has Supervision of other normal pregnancy, antepartum; Dichorionic diamniotic twin pregnancy in second trimester; Antepartum multigravida of advanced maternal age; and Anemia in pregnancy, third trimester on their problem list.  Patient reports  sinus congestion and cough .  Contractions: Irregular. Vag. Bleeding: None.  Movement: Present. Denies leaking of fluid.   Congestion with most of congestion and discomfort on right side of face as well as headache. Taking APAP and mucinex with persistent symptoms. Denies fever/chills   The following portions of the patient's history were reviewed and updated as appropriate: allergies, current medications, past family history, past medical history, past social history, past surgical history and problem list.   Objective:   Vitals:   04/06/22 0827  BP: 122/74  Pulse: 78  Weight: 230 lb (104.3 kg)    Fetal Status: Fetal Heart Rate (bpm): 137/125   Movement: Present     General:  Alert, oriented and cooperative. Patient is in no acute distress. Tender right maxillary sinus, nontender left maxillary sinus, nontender bilateral frontal sinuses  Skin: Skin is warm and dry. No rash noted.   Cardiovascular: Normal heart rate noted  Respiratory: Normal respiratory effort, no problems with respiration noted  Abdomen: Soft, gravid, appropriate for gestational age.  Pain/Pressure: Present     Pelvic: Cervical exam deferred        Extremities: Normal range of motion.  Edema: Trace  Mental Status: Normal mood and affect. Normal behavior. Normal judgment and thought content.   Assessment and Plan:  Pregnancy: G2P1001 at [redacted]w[redacted]d 1. Supervision of other normal pregnancy, antepartum Doing well  Vaginal swabs and GBS negative from prior visit  2.  Dichorionic diamniotic twin pregnancy in third trimester IOL scheduled for 11/28  3. [redacted] weeks gestation of pregnancy Declined repeat exam today given discomfort of exam last time and only being 2cm   4. Sinus congestion Supportive treatment for congestion, abx not indicated Can add fluticasone nasal spray to try and reduce sinus congestion     Term labor symptoms and general obstetric precautions including but not limited to vaginal bleeding, contractions, leaking of fluid and fetal movement were reviewed in detail with the patient. Please refer to After Visit Summary for other counseling recommendations.   No follow-ups on file.  Future Appointments  Date Time Provider Department Center  04/12/2022  6:30 AM [facility] SCHED ROOM [facility] None  05/11/2022 10:55 AM Adrian Blackwater, Rhona Raider, DO [facility] None    Lorriane Shire, MD

## 2022-04-06 NOTE — Telephone Encounter (Signed)
Preadmission screen  

## 2022-04-08 ENCOUNTER — Other Ambulatory Visit: Payer: Self-pay | Admitting: Advanced Practice Midwife

## 2022-04-08 DIAGNOSIS — O30043 Twin pregnancy, dichorionic/diamniotic, third trimester: Secondary | ICD-10-CM

## 2022-04-11 ENCOUNTER — Other Ambulatory Visit (HOSPITAL_COMMUNITY): Payer: Self-pay | Admitting: Obstetrics and Gynecology

## 2022-04-11 NOTE — Progress Notes (Incomplete)
OBSTETRIC ADMISSION HISTORY AND PHYSICAL  Erica Shelton is a 36 y.o. female G2P1001 with IUP at [redacted]w[redacted]d by LMP presenting for eIOL***. She reports +FMs, No LOF, no VB, no blurry vision, headaches or peripheral edema, and RUQ pain.  She plans on breast feeding. She request *** for birth control. She received her prenatal care at [facility]   Dating: By LMP --->  Estimated Date of Delivery: 04/24/22  Sono:    @[redacted]w[redacted]d , dichorionic twin pregnancy, CWD, normal anatomy 1st fetus: cephalic presentation, lower fetus anterior lie, 2437g, 38% EFW 2nd fetus: transverse lie, 2449gm 40%   Prenatal History/Complications: twin pregnancy, AMA  Past Medical History: Past Medical History:  Diagnosis Date   Medical history non-contributory     Past Surgical History: Past Surgical History:  Procedure Laterality Date   NO PAST SURGERIES      Obstetrical History: OB History     Gravida  2   Para  1   Term  1   Preterm      AB      Living  1      SAB      IAB      Ectopic      Multiple  0   Live Births  1           Social History Social History   Socioeconomic History   Marital status: Married    Spouse name: Not on file   Number of children: 1   Years of education: Not on file   Highest education level: Not on file  Occupational History   Occupation: [employer]  Tobacco Use   Smoking status: Never   Smokeless tobacco: Never  Vaping Use   Vaping Use: Never used  Substance and Sexual Activity   Alcohol use: No   Drug use: No   Sexual activity: Yes    Birth control/protection: None  Other Topics Concern   Not on file  Social History Narrative   household pt, husband, son   P1G1   Social Determinants of Health   Financial Resource Strain: Not on file  Food Insecurity: Not on file  Transportation Needs: Not on file  Physical Activity: Not on file  Stress: Not on file  Social Connections: Not on file    Family History: Family History  Adopted: Yes   Problem Relation Age of Onset   Hypertension Brother    Cancer Neg Hx     Allergies: No Known Allergies  (Not in a hospital admission)         Nursing Staff Provider  Office Location [facility]  Dating  LMP  [facility] Model [ ]  Traditional [ ]  Centering [ ]  Mom-Baby Dyad      Language  English  Anatomy [facility]     Flu Vaccine    Genetic/Carrier Screen  NIPS:    AFP:    Horizon:  TDaP Vaccine  02/02/22 Hgb A1C or  GTT Early  Third trimester: normal 2hr  COVID Vaccine     LAB RESULTS   Rhogam    Blood Type B/Positive/-- (05/12 0946)   Baby Feeding Plan Breast  Antibody Negative (05/12 0946)  Contraception   Rubella 1.04 (05/12 0946)  Circumcision Yes, if boy RPR Non Reactive (05/12 0946)   Pediatrician  [facility]  HBsAg Negative (05/12 0946)   Support Person Chris(FOB)  HCVAb    Prenatal Classes   HIV Non Reactive (05/12 0946)     BTL Consent   GBS  neg  VBAC Consent   Pap             DME Rx [x ] BP cuff [ ]  Weight Scale Waterbirth  [ ]  Class [ ]  Consent [ ]  CNM visit  PHQ9 & GAD7 [ x.cw ] new OB [  ] 28 weeks  [  ] 36 weeks Induction  [ ]  Orders Entered [ ] Foley Y/N     Review of Systems   All systems reviewed and negative except as stated in HPI  Last menstrual period 07/18/2021. General appearance: {general exam:16600} Lungs: clear to auscultation bilaterally Heart: regular rate and rhythm Abdomen: soft, non-tender; bowel sounds normal Pelvic: *** Extremities: Homans sign is negative, no sign of DVT DTR's *** Presentation: {desc; fetal presentation:14558} Fetal monitoring{findings; monitor fetal heart monitor:31527} Uterine activity{Uterine contractions:31516}     Prenatal labs: ABO, Rh: B/Positive/-- (05/12 0946) Antibody: Negative (05/12 0946) Rubella: 1.04 (05/12 0946) RPR: Non Reactive (09/20 0835)  HBsAg: Negative (05/12 0946)  HIV: Non Reactive (09/20 0835)  GBS: Negative/-- (11/15 0823)  1 hr Glucola *** Genetic screening  *** Anatomy 08-11-1990  ***  Prenatal Transfer Tool  Maternal Diabetes: {Maternal Diabetes:3043596} Genetic Screening: {Genetic Screening:20205} Maternal Ultrasounds/Referrals: {Maternal Ultrasounds / Referrals:20211} Fetal Ultrasounds or other Referrals:  {Fetal Ultrasounds or Other Referrals:20213} Maternal Substance Abuse:  {Maternal Substance Abuse:20223} Significant Maternal Medications:  {Significant Maternal Meds:20233} Significant Maternal Lab Results:  {Significant Maternal Lab Results:20235} Number of Prenatal Visits:{Prenatal Visits:27860} Other Comments:  {Other Comments:20251}  No results found for this or any previous visit (from the past 24 hour(s)).  Patient Active Problem List   Diagnosis Date Noted   Anemia in pregnancy, third trimester 02/16/2022   Antepartum multigravida of advanced maternal age 66/06/2021   Supervision of other normal pregnancy, antepartum 09/24/2021   Dichorionic diamniotic twin pregnancy in second trimester 09/24/2021    Assessment/Plan:  Erica Shelton is a 36 y.o. G2P1001 at [redacted]w[redacted]d here for***  #Labor:*** #Pain: *** #FWB: *** #ID:  *** #MOF: *** #MOC:*** #Circ:  ***  Avri Paiva N. 02/14/2022, MD  04/11/2022, 11:24 PM

## 2022-04-12 ENCOUNTER — Inpatient Hospital Stay (HOSPITAL_COMMUNITY): Payer: Managed Care, Other (non HMO)

## 2022-04-12 ENCOUNTER — Inpatient Hospital Stay (HOSPITAL_COMMUNITY)
Admission: RE | Admit: 2022-04-12 | Discharge: 2022-04-13 | DRG: 798 | Disposition: A | Discharge status: Home / Self Care | Payer: Managed Care, Other (non HMO) | Attending: Family Medicine | Admitting: Family Medicine

## 2022-04-12 ENCOUNTER — Other Ambulatory Visit: Payer: Self-pay

## 2022-04-12 ENCOUNTER — Encounter (HOSPITAL_COMMUNITY): Payer: Self-pay | Admitting: Family Medicine

## 2022-04-12 ENCOUNTER — Encounter (HOSPITAL_COMMUNITY)
Admission: RE | Disposition: A | Discharge status: Home / Self Care | Payer: Self-pay | Source: Home / Self Care | Attending: Family Medicine

## 2022-04-12 DIAGNOSIS — O9952 Diseases of the respiratory system complicating childbirth: Secondary | ICD-10-CM | POA: Diagnosis present

## 2022-04-12 DIAGNOSIS — Z7982 Long term (current) use of aspirin: Secondary | ICD-10-CM

## 2022-04-12 DIAGNOSIS — O30043 Twin pregnancy, dichorionic/diamniotic, third trimester: Secondary | ICD-10-CM | POA: Diagnosis present

## 2022-04-12 DIAGNOSIS — Z3A38 38 weeks gestation of pregnancy: Secondary | ICD-10-CM | POA: Diagnosis not present

## 2022-04-12 DIAGNOSIS — J45909 Unspecified asthma, uncomplicated: Secondary | ICD-10-CM | POA: Diagnosis present

## 2022-04-12 DIAGNOSIS — O09529 Supervision of elderly multigravida, unspecified trimester: Secondary | ICD-10-CM

## 2022-04-12 DIAGNOSIS — Z302 Encounter for sterilization: Secondary | ICD-10-CM | POA: Diagnosis not present

## 2022-04-12 DIAGNOSIS — O99013 Anemia complicating pregnancy, third trimester: Secondary | ICD-10-CM | POA: Diagnosis present

## 2022-04-12 DIAGNOSIS — O30042 Twin pregnancy, dichorionic/diamniotic, second trimester: Secondary | ICD-10-CM | POA: Diagnosis present

## 2022-04-12 DIAGNOSIS — O9902 Anemia complicating childbirth: Secondary | ICD-10-CM | POA: Diagnosis present

## 2022-04-12 DIAGNOSIS — O09523 Supervision of elderly multigravida, third trimester: Secondary | ICD-10-CM

## 2022-04-12 DIAGNOSIS — Z3480 Encounter for supervision of other normal pregnancy, unspecified trimester: Secondary | ICD-10-CM

## 2022-04-12 HISTORY — DX: Unspecified asthma, uncomplicated: J45.909

## 2022-04-12 HISTORY — PX: TUBAL LIGATION: SHX77

## 2022-04-12 LAB — CBC
HCT: 31.4 % — ABNORMAL LOW (ref 36.0–46.0)
Hemoglobin: 10 g/dL — ABNORMAL LOW (ref 12.0–15.0)
MCH: 27.7 pg (ref 26.0–34.0)
MCHC: 31.8 g/dL (ref 30.0–36.0)
MCV: 87 fL (ref 80.0–100.0)
Platelets: 457 10*3/uL — ABNORMAL HIGH (ref 150–400)
RBC: 3.61 MIL/uL — ABNORMAL LOW (ref 3.87–5.11)
RDW: 15.2 % (ref 11.5–15.5)
WBC: 7.8 10*3/uL (ref 4.0–10.5)
nRBC: 0 % (ref 0.0–0.2)

## 2022-04-12 LAB — TYPE AND SCREEN
ABO/RH(D): B POS
Antibody Screen: NEGATIVE

## 2022-04-12 LAB — RPR: RPR Ser Ql: NONREACTIVE

## 2022-04-12 SURGERY — LIGATION, FALLOPIAN TUBE, POSTPARTUM
Anesthesia: General | Site: Abdomen | Laterality: Bilateral | Wound class: Clean Contaminated

## 2022-04-12 MED ORDER — OXYCODONE HCL 5 MG/5ML PO SOLN: 5.0000 mg | Freq: Once | ORAL | Status: DC | PRN

## 2022-04-12 MED ORDER — OXYCODONE HCL 5 MG PO TABS: 5.0000 mg | ORAL_TABLET | Freq: Once | ORAL | Status: DC | PRN

## 2022-04-12 MED ORDER — LIDOCAINE HCL (CARDIAC) PF 100 MG/5ML IV SOSY: PREFILLED_SYRINGE | INTRAVENOUS | Status: DC | PRN

## 2022-04-12 MED ORDER — BENZOCAINE-MENTHOL 20-0.5 % EX AERO: 1.0000 | INHALATION_SPRAY | CUTANEOUS | Status: DC | PRN

## 2022-04-12 MED ORDER — ROCURONIUM BROMIDE 10 MG/ML (PF) SYRINGE
PREFILLED_SYRINGE | INTRAVENOUS | Status: AC
  Filled 2022-04-12: qty 10

## 2022-04-12 MED ORDER — WITCH HAZEL-GLYCERIN EX PADS: 1.0000 | MEDICATED_PAD | CUTANEOUS | Status: DC | PRN

## 2022-04-12 MED ORDER — SIMETHICONE 80 MG PO CHEW: 80.0000 mg | CHEWABLE_TABLET | ORAL | Status: DC | PRN

## 2022-04-12 MED ORDER — KETOROLAC TROMETHAMINE 30 MG/ML IJ SOLN
INTRAMUSCULAR | Status: DC | PRN
  Administered 2022-04-12: 30 mg via INTRAVENOUS

## 2022-04-12 MED ORDER — LIDOCAINE HCL (PF) 1 % IJ SOLN
30.0000 mL | INTRAMUSCULAR | Status: AC | PRN
  Administered 2022-04-12: 30 mL via SUBCUTANEOUS
  Filled 2022-04-12: qty 30

## 2022-04-12 MED ORDER — SUGAMMADEX SODIUM 200 MG/2ML IV SOLN
INTRAVENOUS | Status: DC | PRN
  Administered 2022-04-12: 200 mg via INTRAVENOUS

## 2022-04-12 MED ORDER — KETOROLAC TROMETHAMINE 30 MG/ML IJ SOLN
INTRAMUSCULAR | Status: AC
  Filled 2022-04-12: qty 1

## 2022-04-12 MED ORDER — SOD CITRATE-CITRIC ACID 500-334 MG/5ML PO SOLN: 30.0000 mL | Freq: Once | ORAL | Status: DC

## 2022-04-12 MED ORDER — LACTATED RINGERS IV SOLN: 500.0000 mL | INTRAVENOUS | Status: DC | PRN

## 2022-04-12 MED ORDER — AMISULPRIDE (ANTIEMETIC) 5 MG/2ML IV SOLN: 10.0000 mg | Freq: Once | INTRAVENOUS | Status: DC | PRN

## 2022-04-12 MED ORDER — HYDROMORPHONE HCL 1 MG/ML IJ SOLN: 0.2500 mg | INTRAMUSCULAR | Status: DC | PRN

## 2022-04-12 MED ORDER — COCONUT OIL OIL: 1.0000 | TOPICAL_OIL | Status: DC | PRN

## 2022-04-12 MED ORDER — LACTATED RINGERS IV SOLN: INTRAVENOUS | Status: DC

## 2022-04-12 MED ORDER — DEXAMETHASONE SODIUM PHOSPHATE 10 MG/ML IJ SOLN
INTRAMUSCULAR | Status: DC | PRN
  Administered 2022-04-12: 10 mg via INTRAVENOUS

## 2022-04-12 MED ORDER — ONDANSETRON HCL 4 MG/2ML IJ SOLN
INTRAMUSCULAR | Status: AC
  Filled 2022-04-12: qty 2

## 2022-04-12 MED ORDER — FENTANYL CITRATE (PF) 100 MCG/2ML IJ SOLN
INTRAMUSCULAR | Status: DC | PRN
  Administered 2022-04-12 (×2): 50 ug via INTRAVENOUS
  Administered 2022-04-12: 100 ug via INTRAVENOUS
  Administered 2022-04-12: 50 ug via INTRAVENOUS

## 2022-04-12 MED ORDER — ONDANSETRON HCL 4 MG PO TABS: 4.0000 mg | ORAL_TABLET | ORAL | Status: DC | PRN

## 2022-04-12 MED ORDER — SOD CITRATE-CITRIC ACID 500-334 MG/5ML PO SOLN
30.0000 mL | ORAL | Status: DC | PRN
  Administered 2022-04-12: 30 mL via ORAL
  Filled 2022-04-12: qty 30

## 2022-04-12 MED ORDER — SUCCINYLCHOLINE CHLORIDE 200 MG/10ML IV SOSY
PREFILLED_SYRINGE | INTRAVENOUS | Status: AC
  Filled 2022-04-12: qty 10

## 2022-04-12 MED ORDER — MISOPROSTOL 25 MCG QUARTER TABLET
25.0000 ug | ORAL_TABLET | Freq: Once | ORAL | Status: AC
  Administered 2022-04-12: 25 ug via VAGINAL
  Filled 2022-04-12: qty 1

## 2022-04-12 MED ORDER — FENTANYL CITRATE (PF) 100 MCG/2ML IJ SOLN: 100.0000 ug | INTRAMUSCULAR | Status: DC | PRN

## 2022-04-12 MED ORDER — STERILE WATER FOR IRRIGATION IR SOLN
Status: DC | PRN
  Administered 2022-04-12: 1000 mL

## 2022-04-12 MED ORDER — SUCCINYLCHOLINE CHLORIDE 200 MG/10ML IV SOSY
PREFILLED_SYRINGE | INTRAVENOUS | Status: DC | PRN
  Administered 2022-04-12: 120 mg via INTRAVENOUS

## 2022-04-12 MED ORDER — MIDAZOLAM HCL 2 MG/2ML IJ SOLN
INTRAMUSCULAR | Status: AC
  Filled 2022-04-12: qty 2

## 2022-04-12 MED ORDER — SENNOSIDES-DOCUSATE SODIUM 8.6-50 MG PO TABS
2.0000 | ORAL_TABLET | Freq: Every day | ORAL | Status: DC
  Filled 2022-04-12: qty 2

## 2022-04-12 MED ORDER — KETOROLAC TROMETHAMINE 30 MG/ML IJ SOLN: 30.0000 mg | Freq: Once | INTRAMUSCULAR | Status: DC | PRN

## 2022-04-12 MED ORDER — OXYCODONE HCL 5 MG PO TABS: 5.0000 mg | ORAL_TABLET | ORAL | Status: DC | PRN

## 2022-04-12 MED ORDER — PROPOFOL 10 MG/ML IV BOLUS
INTRAVENOUS | Status: DC | PRN
  Administered 2022-04-12: 150 mg via INTRAVENOUS
  Administered 2022-04-12: 50 mg via INTRAVENOUS

## 2022-04-12 MED ORDER — BUPIVACAINE HCL (PF) 0.25 % IJ SOLN
INTRAMUSCULAR | Status: DC | PRN
  Administered 2022-04-12: 20 mL

## 2022-04-12 MED ORDER — PROPOFOL 10 MG/ML IV BOLUS
INTRAVENOUS | Status: AC
  Filled 2022-04-12: qty 20

## 2022-04-12 MED ORDER — ROCURONIUM BROMIDE 100 MG/10ML IV SOLN
INTRAVENOUS | Status: DC | PRN
  Administered 2022-04-12: 30 mg via INTRAVENOUS

## 2022-04-12 MED ORDER — BUPIVACAINE HCL (PF) 0.25 % IJ SOLN
INTRAMUSCULAR | Status: AC
  Filled 2022-04-12: qty 30

## 2022-04-12 MED ORDER — PRENATAL MULTIVITAMIN CH
1.0000 | ORAL_TABLET | Freq: Every day | ORAL | Status: DC
  Administered 2022-04-13: 1 via ORAL
  Filled 2022-04-12: qty 1

## 2022-04-12 MED ORDER — ONDANSETRON HCL 4 MG/2ML IJ SOLN: 4.0000 mg | Freq: Once | INTRAMUSCULAR | Status: DC | PRN

## 2022-04-12 MED ORDER — OXYCODONE-ACETAMINOPHEN 5-325 MG PO TABS: 1.0000 | ORAL_TABLET | ORAL | Status: DC | PRN

## 2022-04-12 MED ORDER — LIDOCAINE HCL (CARDIAC) PF 100 MG/5ML IV SOSY
PREFILLED_SYRINGE | INTRAVENOUS | Status: DC | PRN
  Administered 2022-04-12: 60 mg via INTRAVENOUS

## 2022-04-12 MED ORDER — OXYCODONE HCL 5 MG PO TABS: 10.0000 mg | ORAL_TABLET | ORAL | Status: DC | PRN

## 2022-04-12 MED ORDER — OXYTOCIN BOLUS FROM INFUSION
333.0000 mL | Freq: Once | INTRAVENOUS | Status: AC
  Administered 2022-04-12: 333 mL via INTRAVENOUS

## 2022-04-12 MED ORDER — OXYCODONE-ACETAMINOPHEN 5-325 MG PO TABS: 2.0000 | ORAL_TABLET | ORAL | Status: DC | PRN

## 2022-04-12 MED ORDER — LIDOCAINE 2% (20 MG/ML) 5 ML SYRINGE
INTRAMUSCULAR | Status: AC
  Filled 2022-04-12: qty 5

## 2022-04-12 MED ORDER — TETANUS-DIPHTH-ACELL PERTUSSIS 5-2.5-18.5 LF-MCG/0.5 IM SUSY: 0.5000 mL | PREFILLED_SYRINGE | Freq: Once | INTRAMUSCULAR | Status: DC

## 2022-04-12 MED ORDER — IBUPROFEN 600 MG PO TABS
600.0000 mg | ORAL_TABLET | Freq: Four times a day (QID) | ORAL | Status: DC
  Administered 2022-04-12 – 2022-04-13 (×2): 600 mg via ORAL
  Filled 2022-04-12 (×3): qty 1

## 2022-04-12 MED ORDER — OXYTOCIN-SODIUM CHLORIDE 30-0.9 UT/500ML-% IV SOLN
1.0000 m[IU]/min | INTRAVENOUS | Status: DC
  Administered 2022-04-12: 4 m[IU]/min via INTRAVENOUS

## 2022-04-12 MED ORDER — DIBUCAINE (PERIANAL) 1 % EX OINT: 1.0000 | TOPICAL_OINTMENT | CUTANEOUS | Status: DC | PRN

## 2022-04-12 MED ORDER — LACTATED RINGERS IV SOLN: INTRAVENOUS | Status: DC | PRN

## 2022-04-12 MED ORDER — ACETAMINOPHEN 325 MG PO TABS: 650.0000 mg | ORAL_TABLET | ORAL | Status: DC | PRN

## 2022-04-12 MED ORDER — DIPHENHYDRAMINE HCL 25 MG PO CAPS: 25.0000 mg | ORAL_CAPSULE | Freq: Four times a day (QID) | ORAL | Status: DC | PRN

## 2022-04-12 MED ORDER — ZOLPIDEM TARTRATE 5 MG PO TABS: 5.0000 mg | ORAL_TABLET | Freq: Every evening | ORAL | Status: DC | PRN

## 2022-04-12 MED ORDER — ACETAMINOPHEN 10 MG/ML IV SOLN
INTRAVENOUS | Status: DC | PRN
  Administered 2022-04-12: 1000 mg via INTRAVENOUS

## 2022-04-12 MED ORDER — MEPERIDINE HCL 25 MG/ML IJ SOLN: 6.2500 mg | INTRAMUSCULAR | Status: DC | PRN

## 2022-04-12 MED ORDER — MISOPROSTOL 50MCG HALF TABLET
50.0000 ug | ORAL_TABLET | Freq: Once | ORAL | Status: AC
  Administered 2022-04-12: 50 ug via ORAL
  Filled 2022-04-12: qty 1

## 2022-04-12 MED ORDER — ACETAMINOPHEN 10 MG/ML IV SOLN
INTRAVENOUS | Status: AC
  Filled 2022-04-12: qty 100

## 2022-04-12 MED ORDER — OXYTOCIN-SODIUM CHLORIDE 30-0.9 UT/500ML-% IV SOLN
2.5000 [IU]/h | INTRAVENOUS | Status: DC
  Filled 2022-04-12: qty 500

## 2022-04-12 MED ORDER — ONDANSETRON HCL 4 MG/2ML IJ SOLN: 4.0000 mg | INTRAMUSCULAR | Status: DC | PRN

## 2022-04-12 MED ORDER — ONDANSETRON HCL 4 MG/2ML IJ SOLN: 4.0000 mg | Freq: Four times a day (QID) | INTRAMUSCULAR | Status: DC | PRN

## 2022-04-12 MED ORDER — DEXAMETHASONE SODIUM PHOSPHATE 10 MG/ML IJ SOLN
INTRAMUSCULAR | Status: AC
  Filled 2022-04-12: qty 1

## 2022-04-12 MED ORDER — FENTANYL CITRATE (PF) 250 MCG/5ML IJ SOLN
INTRAMUSCULAR | Status: AC
  Filled 2022-04-12: qty 5

## 2022-04-12 MED ORDER — SODIUM CHLORIDE 0.9 % IR SOLN
Status: DC | PRN
  Administered 2022-04-12: 1000 mL

## 2022-04-12 MED ORDER — TERBUTALINE SULFATE 1 MG/ML IJ SOLN: 0.2500 mg | Freq: Once | INTRAMUSCULAR | Status: DC | PRN

## 2022-04-12 SURGICAL SUPPLY — 21 items
BLADE SURG 11 STRL SS (BLADE) ×1 IMPLANT
CLIP FILSHIE TUBAL LIGA STRL (Clip) ×1 IMPLANT
DERMABOND ADVANCED .7 DNX12 (GAUZE/BANDAGES/DRESSINGS) IMPLANT
DRSG OPSITE POSTOP 3X4 (GAUZE/BANDAGES/DRESSINGS) ×1 IMPLANT
DURAPREP 26ML APPLICATOR (WOUND CARE) ×1 IMPLANT
GLOVE BIOGEL PI IND STRL 7.0 (GLOVE) ×1 IMPLANT
GLOVE BIOGEL PI IND STRL 7.5 (GLOVE) ×1 IMPLANT
GLOVE ECLIPSE 7.5 STRL STRAW (GLOVE) ×1 IMPLANT
GOWN STRL REUS W/TWL LRG LVL3 (GOWN DISPOSABLE) ×2 IMPLANT
HIBICLENS CHG 4% 4OZ BTL (MISCELLANEOUS) ×1 IMPLANT
LIGASURE IMPACT 36 18CM CVD LR (INSTRUMENTS) IMPLANT
NEEDLE HYPO 22GX1.5 SAFETY (NEEDLE) ×1 IMPLANT
NS IRRIG 1000ML POUR BTL (IV SOLUTION) ×1 IMPLANT
PACK ABDOMINAL MINOR (CUSTOM PROCEDURE TRAY) ×1 IMPLANT
PROTECTOR NERVE ULNAR (MISCELLANEOUS) ×1 IMPLANT
SPONGE LAP 4X18 RFD (DISPOSABLE) IMPLANT
SUT VICRYL 0 UR6 27IN ABS (SUTURE) ×1 IMPLANT
SUT VICRYL 4-0 PS2 18IN ABS (SUTURE) ×1 IMPLANT
SYR CONTROL 10ML LL (SYRINGE) ×1 IMPLANT
TOWEL OR 17X24 6PK STRL BLUE (TOWEL DISPOSABLE) ×2 IMPLANT
TRAY FOLEY W/BAG SLVR 14FR (SET/KITS/TRAYS/PACK) ×1 IMPLANT

## 2022-04-12 NOTE — Anesthesia Procedure Notes (Signed)
Procedure Name: Intubation Date/Time: 04/12/2022 3:23 PM  Performed by: Ignacia Bayley, CRNAPre-anesthesia Checklist: Patient identified, Patient being monitored, Timeout performed, Emergency Drugs available and Suction available Patient Re-evaluated:Patient Re-evaluated prior to induction Oxygen Delivery Method: Circle System Utilized Preoxygenation: Pre-oxygenation with 100% oxygen Induction Type: IV induction and Rapid sequence Ventilation: Mask ventilation without difficulty Laryngoscope Size: Mac and 3 Grade View: Grade I Tube type: Oral Tube size: 7.0 mm Number of attempts: 1 Airway Equipment and Method: stylet and Video-laryngoscopy Placement Confirmation: ETT inserted through vocal cords under direct vision, positive ETCO2 and breath sounds checked- equal and bilateral Secured at: 21 cm Tube secured with: Tape Dental Injury: Teeth and Oropharynx as per pre-operative assessment

## 2022-04-12 NOTE — H&P (Signed)
OBSTETRIC ADMISSION HISTORY AND PHYSICAL  Erica Shelton is a 36 y.o. female G2P1001 with IUP at [redacted]w[redacted]d by LMP presenting for IOL Di/Di twin gestation. She reports +FMs, No LOF, no VB, no blurry vision, headaches or peripheral edema, and RUQ pain.  She plans on breast feeding. She request BTL for birth control. She received her prenatal care at  [facility]    Dating: By LMP --->  Estimated Date of Delivery: 04/24/22  Sono:   @[redacted]w[redacted]d , CWD, normal anatomy,  A: cephalic presentation, 2437g, EFW B: transverse presentation, 2449g, EFW 40%   Prenatal History/Complications:  Patient Active Problem List   Diagnosis Date Noted   Anemia in pregnancy, third trimester 02/16/2022   Antepartum multigravida of advanced maternal age 70/06/2021   Supervision of other normal pregnancy, antepartum 09/24/2021   Dichorionic diamniotic twin pregnancy in second trimester 09/24/2021    Nursing Staff Provider  Office Location [facility]  Dating  LMP  [facility] Model [ ]  Traditional [ ]  Centering [ ]  Mom-Baby Dyad    Language  English  Anatomy [facility]    Flu Vaccine   Genetic/Carrier Screen  NIPS:   LR AFP:    Horizon:  TDaP Vaccine  02/02/22 Hgb A1C or  GTT Early  Third trimester: normal 2hr  COVID Vaccine    LAB RESULTS   Rhogam   Blood Type B/Positive/-- (05/12 0946)   Baby Feeding Plan Breast  Antibody Negative (05/12 0946)  Contraception  Rubella 1.04 (05/12 0946)  Circumcision Yes, if boy RPR Non Reactive (05/12 0946)   Pediatrician  [facility]  HBsAg Negative (05/12 0946)   Support Person Chris(FOB)  HCVAb   Prenatal Classes  HIV Non Reactive (05/12 0946)     BTL Consent  GBS  neg  VBAC Consent  Pap        DME Rx [x ] BP cuff [ ]  Weight Scale Waterbirth  [ ]  Class [ ]  Consent [ ]  CNM visit  PHQ9 & GAD7 [ x.cw ] new OB [  ] 28 weeks  [  ] 36 weeks Induction  [ ]  Orders Entered [ ] Foley Y/N    Past Medical History: Past Medical History:  Diagnosis Date   Asthma     Past Surgical History: Past  Surgical History:  Procedure Laterality Date   NO PAST SURGERIES      Obstetrical History: OB History     Gravida  2   Para  1   Term  1   Preterm      AB      Living  1      SAB      IAB      Ectopic      Multiple  0   Live Births  1           Social History Social History   Socioeconomic History   Marital status: Married    Spouse name: Not on file   Number of children: 1   Years of education: Not on file   Highest education level: Not on file  Occupational History   Occupation: 08-11-[employer] Company  Tobacco Use   Smoking status: Never   Smokeless tobacco: Never  Vaping Use   Vaping Use: Never used  Substance and Sexual Activity   Alcohol use: No   Drug use: No   Sexual activity: Yes    Birth control/protection: None  Other Topics Concern   Not on file  Social History Narrative  household pt, husband, son   P15G1   Social Determinants of Health   Financial Resource Strain: Not on file  Food Insecurity: No Food Insecurity (04/12/2022)   Hunger Vital Sign    Worried About Running Out of Food in the Last Year: Never true    Ran Out of Food in the Last Year: Never true  Transportation Needs: No Transportation Needs (04/12/2022)   PRAPARE - Administrator, Civil Service (Medical): No    Lack of Transportation (Non-Medical): No  Physical Activity: Not on file  Stress: Not on file  Social Connections: Not on file    Family History: Family History  Adopted: Yes  Problem Relation Age of Onset   Hypertension Brother    Cancer Neg Hx     Allergies: No Known Allergies  Medications Prior to Admission  Medication Sig Dispense Refill Last Dose   albuterol (VENTOLIN HFA) 108 (90 Base) MCG/ACT inhaler Inhale 2 puffs into the lungs every 6 (six) hours as needed for wheezing or shortness of breath. 8 g 2    aspirin EC 81 MG tablet Take 81 mg by mouth daily. Swallow whole.      ferrous sulfate 325 (65 FE) MG tablet Take 325 mg by  mouth daily with breakfast. Patient is taking 1 tablet every other day.      loratadine (CLARITIN) 10 MG tablet Take 10 mg by mouth daily.      mometasone (NASONEX) 50 MCG/ACT nasal spray Place 2 sprays into the nose daily. 1 each 3    Prenatal Vit-Fe Fumarate-FA (PRENATAL VITAMIN PO) Take by mouth.        Review of Systems   All systems reviewed and negative except as stated in HPI  Blood pressure 120/80, pulse 90, temperature (!) 97.4 F (36.3 C), temperature source Oral, resp. rate 18, height 5\' 7"  (1.702 m), weight 104.2 kg, last menstrual period 07/18/2021, SpO2 98 %. General appearance: alert, cooperative, and appears stated age Lungs: clear to auscultation bilaterally Heart: regular rate and rhythm Abdomen: soft, non-tender; bowel sounds normal Extremities: Homans sign is negative, no sign of DVT Presentation: cephalic and breech Fetal monitoring:  A: 130bpm/Moderate variability/ 15x15 accels/ None decels B: 120bpm/Moderate variability/ 15x15 accels/ None decels  Uterine activityNone Dilation: 3 Effacement (%): 40 Station: -3 Exam by:: [place] MD  Prenatal labs: ABO, Rh: --/--/B POS (11/28 0645) Antibody: NEG (11/28 0645) Rubella: 1.04 (05/12 0946) RPR: Non Reactive (09/20 0835)  HBsAg: Negative (05/12 0946)  HIV: Non Reactive (09/20 0835)  GBS: Negative/-- (11/15 0823)  Glucola nml Genetic screening  nml Anatomy 12-23-1972 nml  Prenatal Transfer Tool  Maternal Diabetes: No Genetic Screening: Normal Maternal Ultrasounds/Referrals: Normal Fetal Ultrasounds or other Referrals:  None Maternal Substance Abuse:  No Significant Maternal Medications:  None Significant Maternal Lab Results:  Group B Strep negative Number of Prenatal Visits:Less than or equal to 3 verified prenatal visits Other Comments:  None  Results for orders placed or performed during the hospital encounter of 04/12/22 (from the past 24 hour(s))  Type and screen   Collection Time: 04/12/22  6:45 AM   Result Value Ref Range   ABO/RH(D) B POS    Antibody Screen NEG    Sample Expiration      04/15/2022,2359 Performed at [facility], [address], [place], [place]   CBC   Collection Time: 04/12/22  6:46 AM  Result Value Ref Range   WBC 7.8 4.0 - 10.5 K/uL  RBC 3.61 (L) 3.87 - 5.11 MIL/uL   Hemoglobin 10.0 (L) 12.0 - 15.0 g/dL   HCT 44.9 (L) 67.5 - 91.6 %   MCV 87.0 80.0 - 100.0 fL   MCH 27.7 26.0 - 34.0 pg   MCHC 31.8 30.0 - 36.0 g/dL   RDW 38.4 66.5 - 99.3 %   Platelets 457 (H) 150 - 400 K/uL   nRBC 0.0 0.0 - 0.2 %    Patient Active Problem List   Diagnosis Date Noted   Anemia in pregnancy, third trimester 02/16/2022   Antepartum multigravida of advanced maternal age 69/06/2021   Supervision of other normal pregnancy, antepartum 09/24/2021   Dichorionic diamniotic twin pregnancy in second trimester 09/24/2021    Assessment/Plan:  ZOI DEVINE is a 36 y.o. G2P1001 at [redacted]w[redacted]d here for IOL Di/Di twin pregnancy  #Labor:expectant management, cyto 50/58mcg given #Pain: IV pain meds, epidural upon request #FWB: CAT 1, CAT 1 #ID:  GBS neg #MOF: Breast #MOC:BTL #Circ:  Yes if female  Myrtie Hawk, DO  04/12/2022, 8:04 AM

## 2022-04-12 NOTE — Op Note (Signed)
Erica Shelton 04/12/2022  PREOPERATIVE DIAGNOSIS:  Undesired fertility  POSTOPERATIVE DIAGNOSIS:  Undesired fertility  PROCEDURE:  Postpartum Bilateral Tubal Sterilization using Ligasure   SURGEON:  Dr Candelaria Celeste  ANESTHESIA:  Epidural  COMPLICATIONS:  None immediate.  ESTIMATED BLOOD LOSS:  Less than 20cc.  FLUIDS: 800 mL LR.  URINE OUTPUT:  600 mL of clear urine.  INDICATIONS: 36 y.o. yo G2P2003  with undesired fertility,status post vaginal delivery, desires permanent sterilization. Risks and benefits of procedure discussed with patient including permanence of method, bleeding, infection, injury to surrounding organs and need for additional procedures. Risk failure of 0.5-1% with increased risk of ectopic gestation if pregnancy occurs was also discussed with patient.   FINDINGS:  Normal uterus, tubes, and ovaries.  TECHNIQUE:  The patient was taken to the operating room where her epidural anesthesia was dosed up to surgical level and found to be adequate.  She was then placed in the dorsal supine position and prepped and draped in sterile fashion.  After an adequate timeout was performed, attention was turned to the patient's abdomen where a small transverse skin incision was made under the umbilical fold. The incision was taken down to the layer of fascia using the scalpel, and fascia was incised, and extended bilaterally using Mayo scissors. The peritoneum was entered in a sharp fashion.   Attention was then turned to the patient's uterus, and left fallopian tube was identified and followed out to the fimbriated end.  Ligasure device was used to cauterize and cut the mesosalpinx to proximal end of the fallopian tube, removing 6cm of tube. A similar process was carried out on the right side allowing for bilateral tubal sterilization.    Good hemostasis was noted overall.  Local analgesia was drizzled on both operative sites.The instruments were then removed from the patient's  abdomen and the fascial incision was repaired with 0 Vicryl, and the skin was closed with a 3-0 Monocryl subcuticular stitch. The patient tolerated the procedure well.  Sponge, lap, and needle counts were correct times two.  The patient was then taken to the recovery room awake, extubated and in stable condition.   Levie Heritage, DO 04/12/2022 4:14 PM

## 2022-04-12 NOTE — Discharge Summary (Signed)
Postpartum Discharge Summary      Patient Name: Erica Shelton DOB: 17-Dec-1985 MRN: [id number]  Date of admission: 04/12/2022 Delivery date:   Orella, Cushman [[id number]]  04/12/2022    Neesa, Knapik [[id number]]  04/12/2022  Delivering provider:    Roe Rutherford [[id number]]  Ayumi, Wangerin [[id number]]  Truett Mainland  Date of discharge: 04/13/2022  Admitting diagnosis: Twin gestation in third trimester [O30.003] Intrauterine pregnancy: [redacted]w[redacted]d    Secondary diagnosis:  Active Problems:   Supervision of other normal pregnancy, antepartum   Dichorionic diamniotic twin pregnancy in second trimester   Antepartum multigravida of advanced maternal age   Anemia in pregnancy, third trimester  Additional problems:     Discharge diagnosis: Term Pregnancy Delivered and Di/di twins                                               Post partum procedures:postpartum tubal ligation Augmentation: AROM, Pitocin, and Cytotec Complications: None  Hospital course: Induction of Labor With Vaginal Delivery   36y.o. yo G2P1001 at 33w2das admitted to the hospital 04/12/2022 for induction of labor.  Indication for induction:  twin gestation .  Patient had an uncomplicated labor course. Membrane Rupture Time/Date:    MaRheagan, Nayak0[[id number]]12:32 PM    MaAlma Friendly0[[id number]]1:56 PM ,   MaGinni, Eichler0[[id number]]04/12/2022    MaDezyrae, Kensinger0[[id number]]04/12/2022   Delivery Method:   MaRoe Rutherford0[[id number]]Vaginal, Spontaneous    MaShemica, Meath0[[id number]]Vaginal, Spontaneous  Episiotomy:    MaLeonette, Tischer0[[id number]]None    MaEbelyn, [place]0[[id number]]None  Lacerations:     MaLaurna, Shetley0[[id number]]1st degree;Perineal;Labial    MaLatissa, Frick0[[id number]]1st degree;Labial;Perineal  Details of delivery can be found in separate delivery note.  Patient had a  postpartum course significant for BTL. Patient is discharged home 04/12/22.  Newborn Data: Birth date:   MaRockie, Vawter0[[id number]]04/12/2022    MaKrisa, Blattner0[[id number]]04/12/2022  Birth time:   MaShima, Compere0[[id number]]1:50 PM    MaMikhia, Dusek0[[id number]]1:57 PM  Gender:   MaTorianna, Junio0[[id number]]Female    MaGarnetta, Fedrick0[[id number]]Female  Living status:   MaLakisa, Lotz0[[id number]]Living    MaRanie, Chinchilla0[[id number]]Living  Apgars:   MaTiannah, Greenly0[[id number]]9 [address]0[[id number]]9 [place]   MaAbena, Erdman0[[id number]]9 [place]  MaMandy, [place]0[[id number]]9  Weight:   MaTaleia, Sadowski0[[id number]]    MaErnesto, Lashway0[[id number]]   Magnesium Sulfate received: No BMZ received: No Rhophylac:No MMR:No T-DaP:Given prenatally Flu: No Transfusion:No  Physical exam  Vitals:   04/12/22 1100 04/12/22 1133 04/12/22 1235 04/12/22 1416  BP: 130/79 136/77 127/79 118/66  Pulse: 69 66 82 66  Resp: _0 Temp: 97.9 F (36.6 C)     TempSrc: Oral     SpO2:      Weight:      Height:       General: alert,  cooperative, and no distress Lochia: appropriate Uterine Fundus: firm Incision: Healing well with no significant drainage DVT Evaluation: No evidence of DVT seen on physical exam. Labs: Lab Results  Component Value Date   WBC 7.8 04/12/2022   HGB 10.0 (L) 04/12/2022   HCT 31.4 (L) 04/12/2022   MCV 87.0 04/12/2022   PLT 457 (H) 04/12/2022      Latest Ref Rng & Units 01/07/2020    7:07 AM  CMP  Glucose 65 - 99 mg/dL 86   BUN 7 - 25 mg/dL 12   Creatinine 0.50 - 1.10 mg/dL 0.69   Sodium 135 - 146 mmol/L 141   Potassium 3.5 - 5.3 mmol/L 4.8   Chloride 98 - 110 mmol/L 105   CO2 20 - 32 mmol/L 21   Calcium 8.6 - 10.2 mg/dL 9.6    Edinburgh Score:    12/29/2017    9:35 AM  Edinburgh Postnatal Depression Scale Screening Tool  I have been able to laugh and see  the funny side of things. 0  I have looked forward with enjoyment to things. 0  I have blamed myself unnecessarily when things went wrong. 0  I have been anxious or worried for no good reason. 0  I have felt scared or panicky for no good reason. 0  Things have been getting on top of me. 0  I have been so unhappy that I have had difficulty sleeping. 0  I have felt sad or miserable. 0  I have been so unhappy that I have been crying. 0  The thought of harming myself has occurred to me. 0  Edinburgh Postnatal Depression Scale Total 0     After visit meds:  Allergies as of 04/13/2022   No Known Allergies      Medication List     STOP taking these medications    aspirin EC 81 MG tablet       TAKE these medications    acetaminophen 325 MG tablet Commonly known as: Tylenol Take 2 tablets (650 mg total) by mouth every 4 (four) hours as needed (for pain scale < 4).   albuterol 108 (90 Base) MCG/ACT inhaler Commonly known as: VENTOLIN HFA Inhale 2 puffs into the lungs every 6 (six) hours as needed for wheezing or shortness of breath.   ferrous sulfate 325 (65 FE) MG tablet Take 325 mg by mouth daily with breakfast. Patient is taking 1 tablet every other day.   ibuprofen 600 MG tablet Commonly known as: ADVIL Take 1 tablet (600 mg total) by mouth every 6 (six) hours.   loratadine 10 MG tablet Commonly known as: CLARITIN Take 10 mg by mouth daily.   mometasone 50 MCG/ACT nasal spray Commonly known as: NASONEX Place 2 sprays into the nose daily.   oxyCODONE 5 MG immediate release tablet Commonly known as: Oxy IR/ROXICODONE Take 1 tablet (5 mg total) by mouth every 4 (four) hours as needed (pain scale 4-7).   PRENATAL VITAMIN PO Take by mouth.         Discharge home in stable condition Infant Feeding: Breast Infant Disposition:home with mother Discharge instruction: per After Visit Summary and Postpartum booklet. Activity: Advance as tolerated. Pelvic rest for  6 weeks.  Diet: routine diet Future Appointments: Future Appointments  Date Time Provider [place]  05/11/2022 10:55 AM Truett Mainland, DO [facility] None   Follow up Visit:   Please schedule this patient for a In person postpartum visit in 4 weeks with the following  provider: MD. Additional Postpartum F/U:none High risk pregnancy complicated by:  di/di twins. Delivery mode:     Smriti, [place] [[id number]]  Vaginal, Spontaneous    Kandee, Escalante [[id number]]  Vaginal, Spontaneous  Anticipated Birth Control:  BTL done [facility]   04/12/2022 Truett Mainland, DO

## 2022-04-12 NOTE — Anesthesia Preprocedure Evaluation (Addendum)
Anesthesia Evaluation  Patient identified by MRN, date of birth, ID band Patient awake    Reviewed: Allergy & Precautions, NPO status , Patient's Chart, lab work & pertinent test results  Airway Mallampati: II  TM Distance: >3 FB Neck ROM: Full    Dental no notable dental hx.    Pulmonary asthma    Pulmonary exam normal breath sounds clear to auscultation       Cardiovascular negative cardio ROS Normal cardiovascular exam Rhythm:Regular Rate:Normal     Neuro/Psych negative neurological ROS  negative psych ROS   GI/Hepatic negative GI ROS, Neg liver ROS,,,  Endo/Other  negative endocrine ROS    Renal/GU negative Renal ROS  negative genitourinary   Musculoskeletal negative musculoskeletal ROS (+)    Abdominal   Peds negative pediatric ROS (+)  Hematology  (+) Blood dyscrasia, anemia Hb 10, plt 457   Anesthesia Other Findings   Reproductive/Obstetrics (+) Pregnancy Desires sterility, s/p twin delivery without regional anesthesia                             Anesthesia Physical Anesthesia Plan  ASA: 2  Anesthesia Plan: General   Post-op Pain Management: Ofirmev IV (intra-op)* and Toradol IV (intra-op)*   Induction: Intravenous and Rapid sequence  PONV Risk Score and Plan: Ondansetron, Dexamethasone and Treatment may vary due to age or medical condition  Airway Management Planned: Oral ETT  Additional Equipment: None  Intra-op Plan:   Post-operative Plan: Extubation in OR  Informed Consent: I have reviewed the patients History and Physical, chart, labs and discussed the procedure including the risks, benefits and alternatives for the proposed anesthesia with the patient or authorized representative who has indicated his/her understanding and acceptance.     Dental advisory given  Plan Discussed with: CRNA  Anesthesia Plan Comments: (Pt refusing regional anesthesia)        Anesthesia Quick Evaluation

## 2022-04-12 NOTE — Transfer of Care (Signed)
Immediate Anesthesia Transfer of Care Note  Patient: Erica Shelton  Procedure(s) Performed: POST PARTUM TUBAL LIGATION (Bilateral: Abdomen)  Patient Location: PACU  Anesthesia Type:General  Level of Consciousness: awake  Airway & Oxygen Therapy: Patient Spontanous Breathing and Patient connected to nasal cannula oxygen  Post-op Assessment: Report given to RN and Post -op Vital signs reviewed and stable  Post vital signs: Reviewed and stable  Last Vitals:  Vitals Value Taken Time  BP 124/67 04/12/22 1615  Temp    Pulse 79 04/12/22 1618  Resp 15 04/12/22 1618  SpO2 99 % 04/12/22 1618  Vitals shown include unvalidated device data.  Last Pain:  Vitals:   04/12/22 1516  TempSrc:   PainSc: 0-No pain         Complications: No notable events documented.

## 2022-04-12 NOTE — Progress Notes (Signed)
Risks of procedure discussed with patient including but not limited to: risk of regret, permanence of method, bleeding, infection, injury to surrounding organs and need for additional procedures.  Failure risk of 1 -2 % with increased risk of ectopic gestation if pregnancy occurs was also discussed with patient.    Levie Heritage, DO 04/12/2022 2:41 PM

## 2022-04-12 NOTE — Progress Notes (Signed)
Patient ID: Erica Shelton, female   DOB: 1986-01-25, 36 y.o.   MRN: [id number]  Patient doing well with pitocin.  BP 127/79   Pulse 82   Temp 97.9 F (36.6 C) (Oral)   Resp 18   Ht 5\' 7"  (1.702 m)   Wt 104.2 kg   LMP 07/18/2021   SpO2 98%   BMI 35.99 kg/m   Dilation: 5 Effacement (%): 80 Cervical Position: Middle Station: -2 Presentation: Vertex Exam by:: Dr. [email or web address]  AROM with clear fluid.  Expectant management.   Adrian Blackwater, DO 04/12/2022 1:08 PM

## 2022-04-13 ENCOUNTER — Encounter: Payer: Managed Care, Other (non HMO) | Admitting: Obstetrics and Gynecology

## 2022-04-13 ENCOUNTER — Other Ambulatory Visit: Payer: Self-pay

## 2022-04-13 LAB — CBC
HCT: 28.3 % — ABNORMAL LOW (ref 36.0–46.0)
Hemoglobin: 8.8 g/dL — ABNORMAL LOW (ref 12.0–15.0)
MCH: 27.4 pg (ref 26.0–34.0)
MCHC: 31.1 g/dL (ref 30.0–36.0)
MCV: 88.2 fL (ref 80.0–100.0)
Platelets: 374 10*3/uL (ref 150–400)
RBC: 3.21 MIL/uL — ABNORMAL LOW (ref 3.87–5.11)
RDW: 15.1 % (ref 11.5–15.5)
WBC: 14.2 10*3/uL — ABNORMAL HIGH (ref 4.0–10.5)
nRBC: 0 % (ref 0.0–0.2)

## 2022-04-13 MED ORDER — IBUPROFEN 600 MG PO TABS: 600.0000 mg | ORAL_TABLET | Freq: Four times a day (QID) | ORAL | 0 refills | Status: DC

## 2022-04-13 MED ORDER — ACETAMINOPHEN 325 MG PO TABS: 650.0000 mg | ORAL_TABLET | ORAL | 0 refills | Status: DC | PRN

## 2022-04-13 MED ORDER — OXYCODONE HCL 5 MG PO TABS: 5.0000 mg | ORAL_TABLET | ORAL | 0 refills | Status: DC | PRN

## 2022-04-13 NOTE — Lactation Note (Signed)
This note was copied from a baby's chart. Lactation Consultation Note  Patient Name: Genessa Beman Today's Date: 04/13/2022 Reason for consult: Initial assessment;Multiple gestation;Breastfeeding assistance;Early term 37-38.6wks;Infant weight loss (3.82% WL) Age:36 hours  LC entered the room and the infant was in the bassinet.  Per the birth parent the infant was feeding well, but has gotten sleepy. LC spoke with the birth parent about the importance of having feedings that last at least 15 min.  LC set the birth parent up with a DEBP and instructed her to supplement the infants after feedings according to the supplementation guidelines.  The birth parent is aware that if she is unable to produce the amount needed to supplement the infants, she may need to use formula or DBM.  The birth parent began to pump and the infants started to show feeding cues.  LC assisted the birth parent with latching infant B to the left breast in the cross-cradle position.  Baby B latched with his tongue down, lips flanged, the infant began falling asleep at the breast and had to be stimulated to continue sucking, swallows were noted.  Baby B fed for 7 min before falling asleep at the breast.  The LC assisted the birth parent with putting baby A to the right breast in the cross-cradle position.  Baby A's tongue was down, lips were flanged, sucking was rhythmic, and some swallows were noted.  Baby A fed for 7 min while LC was in the room and was still feeding when LC left.  LC reviewed outpatient services brochure, put my name on the board, and exited the room.   Infant Feeding Plan:  Breastfeed 8+ times in 24 hours according to feeding cues.  Pump after feedings and feed expressed milk to the infant's via a bottle according to supplementation guidelines that were provided.  Keep the total feedings <30 min.  Call RN/LC for assistance with breastfeeding.    Maternal Data Has patient been taught Hand  Expression?: Yes Does the patient have breastfeeding experience prior to this delivery?: Yes How long did the patient breastfeed?: 2 years (The child is currently 62 years old)  Feeding Mother's Current Feeding Choice: Breast Milk  LATCH Score Latch: Grasps breast easily, tongue down, lips flanged, rhythmical sucking.  Audible Swallowing: A few with stimulation  Type of Nipple: Everted at rest and after stimulation  Comfort (Breast/Nipple): Soft / non-tender  Hold (Positioning): Assistance needed to correctly position infant at breast and maintain latch.  LATCH Score: 8   Lactation Tools Discussed/Used Tools: Flanges;Pump Flange Size: 24 Breast pump type: Double-Electric Breast Pump Pump Education: Setup, frequency, and cleaning Reason for Pumping: Infant's not feeding well Pumping frequency: After feedings  Interventions Interventions: Breast feeding basics reviewed;Assisted with latch;Adjust position;DEBP;Education;LC Services brochure  Discharge Pump: DEBP;Personal (Medela)  Consult Status Consult Status: Follow-up Date: 04/14/22 Follow-up type: In-patient    Orvil Feil Larkyn Greenberger 04/13/2022, 10:06 AM

## 2022-04-13 NOTE — Anesthesia Postprocedure Evaluation (Signed)
Anesthesia Post Note  Patient: Erica Shelton  Procedure(s) Performed: POST PARTUM TUBAL LIGATION (Bilateral: Abdomen)     Patient location during evaluation: PACU Anesthesia Type: General Level of consciousness: awake and alert, oriented and patient cooperative Pain management: pain level controlled Vital Signs Assessment: post-procedure vital signs reviewed and stable Respiratory status: spontaneous breathing, nonlabored ventilation and respiratory function stable Cardiovascular status: blood pressure returned to baseline and stable Postop Assessment: no apparent nausea or vomiting Anesthetic complications: no   No notable events documented.  Last Vitals:  Vitals:   04/13/22 0234 04/13/22 0551  BP: 118/77 128/76  Pulse: (!) 59 (!) 57  Resp: 16 17  Temp: 36.4 C 36.5 C  SpO2: 99% 100%    Last Pain:  Vitals:   04/13/22 0551  TempSrc: Axillary  PainSc:                  [name]

## 2022-04-13 NOTE — Lactation Note (Signed)
This note was copied from a baby's chart. Lactation Consultation Note  Patient Name: Erica Shelton Today's Date: 04/13/2022 Reason for consult: Follow-up assessment;Early term 37-38.6wks;Breastfeeding assistance;Infant weight loss;Difficult latch (2.16% WL) Age:36 hours  LC entered the room and both infants had been taken to be circumcised.  Per the birth parent Baby A ate 5 mL of EBM prior to feeding at the breast for 12 min.  She stated that the infant was upset and wanted to feed, so she gave him her expressed milk to calm him down.  She said that he did much better at the breast after getting some expressed colostrum.  Per the birth parent, Baby B fed for 12 min at the breast and was much more engaged.  LC encouraged the birth parent to try to keep the infant on for 15 min if she can.  The birth parent was getting ready to pump and stated that she would continue putting the infants to the breast and pumping to supplement them.  LC told the birth parent to watch the infants' behavior and if they seemed hungry after being fed and supplemented with her milk, she would need to supplement them.  She is aware of the supplementation guidelines and expected volumes.  The birth parent will call RN/LC for assistance with breastfeeding.   Maternal Data    Feeding Mother's Current Feeding Choice: Breast Milk  LATCH Score                    Lactation Tools Discussed/Used    Interventions Interventions: Education  Discharge    Consult Status Consult Status: Follow-up Date: 04/14/22 Follow-up type: In-patient    Orvil Feil Rissie Sculley 04/13/2022, 2:00 PM

## 2022-04-13 NOTE — Lactation Note (Signed)
This note was copied from a baby's chart. Lactation Consultation Note  Patient Name: Erica Shelton Today's Date: 04/13/2022   Age:36 hours RN Victorino Dike) will ask Birth Parent if she would like to be seen by [facility] services on MBU, previously Birth Parent had declined in L&D.  Maternal Data    Feeding    LATCH Score                    Lactation Tools Discussed/Used    Interventions    Discharge    Consult Status      Erica Shelton 04/13/2022, 12:49 AM

## 2022-04-14 ENCOUNTER — Encounter: Payer: Managed Care, Other (non HMO) | Admitting: Family Medicine

## 2022-04-14 LAB — SURGICAL PATHOLOGY

## 2022-04-15 ENCOUNTER — Ambulatory Visit: Payer: Managed Care, Other (non HMO)

## 2022-04-18 ENCOUNTER — Encounter: Payer: Self-pay | Admitting: General Practice

## 2022-04-20 ENCOUNTER — Telehealth (HOSPITAL_COMMUNITY): Payer: Self-pay | Admitting: *Deleted

## 2022-04-20 NOTE — Telephone Encounter (Signed)
Mom reports feeling good. No concerns about herself at this time. EPDS=0 [facility] score=0) Mom reports babies are doing well. Feeding, peeing, and pooping without difficulty. Safe sleep reviewed. Mom reports no concerns about babies at present.  Duffy Rhody, RN 04-20-2022 at 10:33am

## 2022-05-11 ENCOUNTER — Ambulatory Visit: Payer: Managed Care, Other (non HMO) | Admitting: Family Medicine

## 2022-05-11 NOTE — Progress Notes (Signed)
Post Partum Visit Note  Erica Shelton is a 36 y.o. G49P2003 female who presents for a postpartum visit. She is 4 weeks postpartum following a normal spontaneous vaginal delivery.  I have fully reviewed the prenatal and intrapartum course. The delivery was at 38 gestational weeks.  Anesthesia: none. Postpartum course has been normal. Babies are doing well. Babies are feeding by breast. Bleeding no bleeding. Bowel function is normal. Bladder function is normal. Patient is sexually active. Contraception method is tubal ligation. Postpartum depression screening: negative.   Upstream - 05/11/22 1104       Pregnancy Intention Screening   Does the patient want to become pregnant in the next year? No    Does the patient's partner want to become pregnant in the next year? No    Would the patient like to discuss contraceptive options today? No      Contraception Wrap Up   Current Method No Method - Other Reason    Reason for No Current Contraceptive Method at Intake ([facility] Only) --   Tubal Ligation   End Method --   Tubal Ligation   Contraception Counseling Provided No    How was the end contraceptive method provided? N/A            The pregnancy intention screening data noted above was reviewed. Potential methods of contraception were discussed. The patient elected to proceed with -- (Tubal Ligation).   Edinburgh Postnatal Depression Scale - 05/11/22 1103       Edinburgh Postnatal Depression Scale:  In the Past 7 Days   I have been able to laugh and see the funny side of things. 0    I have looked forward with enjoyment to things. 0    I have blamed myself unnecessarily when things went wrong. 0    I have been anxious or worried for no good reason. 0    I have felt scared or panicky for no good reason. 0    Things have been getting on top of me. 0    I have been so unhappy that I have had difficulty sleeping. 0    I have felt sad or miserable. 0    I have been so unhappy that I have  been crying. 0    The thought of harming myself has occurred to me. 0    Edinburgh Postnatal Depression Scale Total 0             Health Maintenance Due  Topic Date Due   INFLUENZA VACCINE  Never done   COVID-19 Vaccine (3 - 2023-24 season) 01/14/2022    The following portions of the patient's history were reviewed and updated as appropriate: allergies, current medications, past family history, past medical history, past social history, past surgical history, and problem list.  Review of Systems Pertinent items are noted in HPI.  Objective:  BP 118/78   Pulse 75   Wt 199 lb (90.3 kg)   LMP 07/18/2021   Breastfeeding Yes   BMI 31.17 kg/m    General:  alert, cooperative, and no distress   Breasts:  not indicated  Lungs: clear to auscultation bilaterally  Heart:  regular rate and rhythm, S1, S2 normal, no murmur, click, rub or gallop  Abdomen: soft, non-tender; bowel sounds normal; no masses,  no organomegaly   Wound well approximated incision  GU exam:   Normal. Stitch not dissolved - removed. Labial laceration well healed.       Assessment:  1. Postpartum exam    Plan:   Essential components of care per ACOG recommendations:  1.  Mood and well being: Patient with negative depression screening today. Reviewed local resources for support.  - Patient tobacco use? No.   - hx of drug use? No.    2. Infant care and feeding:  -Patient currently breastmilk feeding? Yes. Reviewed importance of draining breast regularly to support lactation.  -Social determinants of health (SDOH) reviewed in EPIC. No concerns  3. Sexuality, contraception and birth spacing - Patient does not want a pregnancy in the next year.   - Reviewed reproductive life planning. Reviewed contraceptive methods based on pt preferences and effectiveness.  Patient desired Female Sterilization today.   - Discussed birth spacing of 18 months  4. Sleep and fatigue -Encouraged family/partner/community  support of 4 hrs of uninterrupted sleep to help with mood and fatigue  5. Physical Recovery  - Discussed patients delivery and complications. She describes her labor as good. - Patient had a Vaginal, no problems at delivery. Patient had a  labial  laceration. Perineal healing reviewed. Patient expressed understanding - Patient has urinary incontinence? No. - Patient is safe to resume physical and sexual activity  6.  Health Maintenance - HM due items addressed Yes - Last pap smear  Diagnosis  Date Value Ref Range Status  04/22/2021   Final   - Negative for intraepithelial lesion or malignancy (NILM)   Pap smear not done at today's visit.  -Breast Cancer screening indicated? No.   7. Chronic Disease/Pregnancy Condition follow up: None  - PCP follow up  Levie Heritage, DO [facility], [facility]

## 2022-08-10 ENCOUNTER — Encounter: Payer: Self-pay | Admitting: Family Medicine

## 2022-08-10 MED ORDER — AMOXICILLIN 875 MG PO TABS: 875.0000 mg | ORAL_TABLET | Freq: Two times a day (BID) | ORAL | 0 refills | Status: AC

## 2022-11-02 ENCOUNTER — Ambulatory Visit
Admission: EM | Admit: 2022-11-02 | Discharge: 2022-11-02 | Disposition: A | Discharge status: Home / Self Care | Payer: Managed Care, Other (non HMO) | Attending: Nurse Practitioner | Admitting: Nurse Practitioner

## 2022-11-02 ENCOUNTER — Encounter: Payer: Self-pay | Admitting: Family Medicine

## 2022-11-02 DIAGNOSIS — W57XXXA Bitten or stung by nonvenomous insect and other nonvenomous arthropods, initial encounter: Secondary | ICD-10-CM

## 2022-11-02 DIAGNOSIS — L03114 Cellulitis of left upper limb: Secondary | ICD-10-CM | POA: Diagnosis not present

## 2022-11-02 DIAGNOSIS — S40862A Insect bite (nonvenomous) of left upper arm, initial encounter: Secondary | ICD-10-CM

## 2022-11-02 MED ORDER — CEPHALEXIN 500 MG PO CAPS: 500.0000 mg | ORAL_CAPSULE | Freq: Four times a day (QID) | ORAL | 0 refills | Status: AC

## 2022-11-02 MED ORDER — TRIAMCINOLONE ACETONIDE 0.1 % EX CREA: 1.0000 | TOPICAL_CREAM | Freq: Two times a day (BID) | CUTANEOUS | 0 refills | Status: AC

## 2022-11-02 NOTE — Discharge Instructions (Signed)
Start Keflex 4 times a day for 7 days You may use topical Kenalog cream to the area for itching Follow-up with your PCP if your symptoms do not improve Please go to the emergency room if you develop any worsening symptoms

## 2022-11-02 NOTE — ED Triage Notes (Addendum)
Pt presents to UC w/ c/o insect bite on left arm x3 days. Rash has grown. Cortisone cream and calamine lotion have not helped.

## 2022-11-02 NOTE — ED Provider Notes (Signed)
[facility]    CSN: [id number] Arrival date & time: 11/02/22  0807      History   Chief Complaint No chief complaint on file.   HPI Erica Shelton is a 37 y.o. female presents for evaluation of a skin infection.  Patient reports 2 days ago she was bitten by an unknown insect on her left mid arm.  States has been very pruritic but over the past few days she has noticed redness and warmth extending from the mid arm to her upper and lower arm.  No fevers or chills.  No drainage.  No history of MRSA.  She is breast-feeding.  She has been using topical calamine lotion and cortisone cream with no improvement.  No other concerns at this time.  HPI  Past Medical History:  Diagnosis Date   Asthma     There are no problems to display for this patient.   Past Surgical History:  Procedure Laterality Date   NO PAST SURGERIES     TUBAL LIGATION Bilateral 04/12/2022   Procedure: POST PARTUM TUBAL LIGATION;  Surgeon: Levie Heritage, DO;  Location: [facility];  Service: Gynecology;  Laterality: Bilateral;    OB History     Gravida  2   Para  2   Term  2   Preterm      AB      Living  3      SAB      IAB      Ectopic      Multiple  1   Live Births  3            Home Medications    Prior to Admission medications   Medication Sig Start Date End Date Taking? Authorizing Provider  cephALEXin (KEFLEX) 500 MG capsule Take 1 capsule (500 mg total) by mouth 4 (four) times daily for 7 days. 11/02/22 11/09/22 Yes Radford Pax, NP  triamcinolone cream (KENALOG) 0.1 % Apply 1 Application topically 2 (two) times daily. 11/02/22  Yes Radford Pax, NP  Prenatal Vit-Fe Fumarate-FA (PRENATAL VITAMIN PO) Take by mouth.    [provider]    Family History Family History  Adopted: Yes  Problem Relation Age of Onset   Hypertension Brother    Cancer Neg Hx     Social History Social History   Tobacco Use   Smoking status: Never   Smokeless tobacco:  Never  Vaping Use   Vaping Use: Never used  Substance Use Topics   Alcohol use: No   Drug use: No     Allergies   Patient has no known allergies.   Review of Systems Review of Systems  Skin:        Bug bite/infection     Physical Exam Triage Vital Signs ED Triage Vitals  Enc Vitals Group     BP 11/02/22 0819 117/72     Pulse Rate 11/02/22 0819 69     Resp 11/02/22 0819 16     Temp 11/02/22 0819 98.5 F (36.9 C)     Temp Source 11/02/22 0819 Oral     SpO2 11/02/22 0819 97 %     Weight --      Height --      Head Circumference --      Peak Flow --      Pain Score 11/02/22 0822 0     Pain Loc --      Pain Edu? --  Excl. in GC? --    No data found.  Updated Vital Signs BP 117/72 (BP Location: Right Arm)   Pulse 69   Temp 98.5 F (36.9 C) (Oral)   Resp 16   LMP 10/19/2022 (Approximate)   SpO2 97%   Visual Acuity Right Eye Distance:   Left Eye Distance:   Bilateral Distance:    Right Eye Near:   Left Eye Near:    Bilateral Near:     Physical Exam Vitals and nursing note reviewed.  Constitutional:      Appearance: Normal appearance.  HENT:     Head: Normocephalic and atraumatic.  Eyes:     Pupils: Pupils are equal, round, and reactive to light.  Cardiovascular:     Rate and Rhythm: Normal rate.  Pulmonary:     Effort: Pulmonary effort is normal.  Skin:    General: Skin is warm and dry.          Comments: 2 insect bites to the left mid arm with warmth, erythema and mild swelling extending to the upper arm and forearm to the wrist.  No vesicles or lesions.  No pitting edema.  Radial pulse +2  Neurological:     General: No focal deficit present.     Mental Status: She is alert and oriented to person, place, and time.  Psychiatric:        Mood and Affect: Mood normal.        Behavior: Behavior normal.      UC Treatments / Results  Labs (all labs ordered are listed, but only abnormal results are displayed) Labs Reviewed - No data to  display  EKG   Radiology No results found.  Procedures Procedures (including critical care time)  Medications Ordered in UC Medications - No data to display  Initial Impression / Assessment and Plan / UC Course  I have reviewed the triage vital signs and the nursing notes.  Pertinent labs & imaging results that were available during my care of the patient were reviewed by me and considered in my medical decision making (see chart for details).     Reviewed symptoms and exam with patient.  No red flags.  Will start Keflex for cellulitis.  Topical Kenalog cream as needed for itching.  Patient to follow-up with PCP if symptoms do not improve.  Strict ER precautions reviewed and patient verbalized understanding Final Clinical Impressions(s) / UC Diagnoses   Final diagnoses:  Cellulitis of arm, left  Insect bite of left upper arm, initial encounter     Discharge Instructions      Start Keflex 4 times a day for 7 days You may use topical Kenalog cream to the area for itching Follow-up with your PCP if your symptoms do not improve Please go to the emergency room if you develop any worsening symptoms   ED Prescriptions     Medication Sig Dispense Auth. Provider   cephALEXin (KEFLEX) 500 MG capsule Take 1 capsule (500 mg total) by mouth 4 (four) times daily for 7 days. 28 capsule Radford Pax, NP   triamcinolone cream (KENALOG) 0.1 % Apply 1 Application topically 2 (two) times daily. 30 g Radford Pax, NP      PDMP not reviewed this encounter.   Radford Pax, NP 11/02/22 [phone]

## 2022-12-23 NOTE — Telephone Encounter (Signed)
Pcp removed

## 2023-01-30 ENCOUNTER — Ambulatory Visit: Payer: Managed Care, Other (non HMO) | Admitting: Family Medicine

## 2023-02-14 ENCOUNTER — Ambulatory Visit: Payer: Managed Care, Other (non HMO) | Admitting: Family Medicine

## 2023-05-30 IMAGING — MG DIGITAL DIAGNOSTIC BILAT W/ TOMO W/ CAD
6 of 10 series · 6 of 30 positions shown · non-contrast
Comparison: None.

CLINICAL DATA: 35-year-old female presenting for baseline mammogram
to evaluate a lump of right breast. She saw her doctor Hermie and
this has not changed in size since that time.

EXAM:
DIGITAL DIAGNOSTIC BILATERAL MAMMOGRAM WITH TOMOSYNTHESIS AND CAD;
ULTRASOUND RIGHT BREAST LIMITED
TECHNIQUE: Bilateral digital diagnostic mammography and breast tomosynthesis
was performed. The images were evaluated with computer-aided
detection.; Targeted ultrasound examination of the right breast was
performed

[L CC synth-2D]
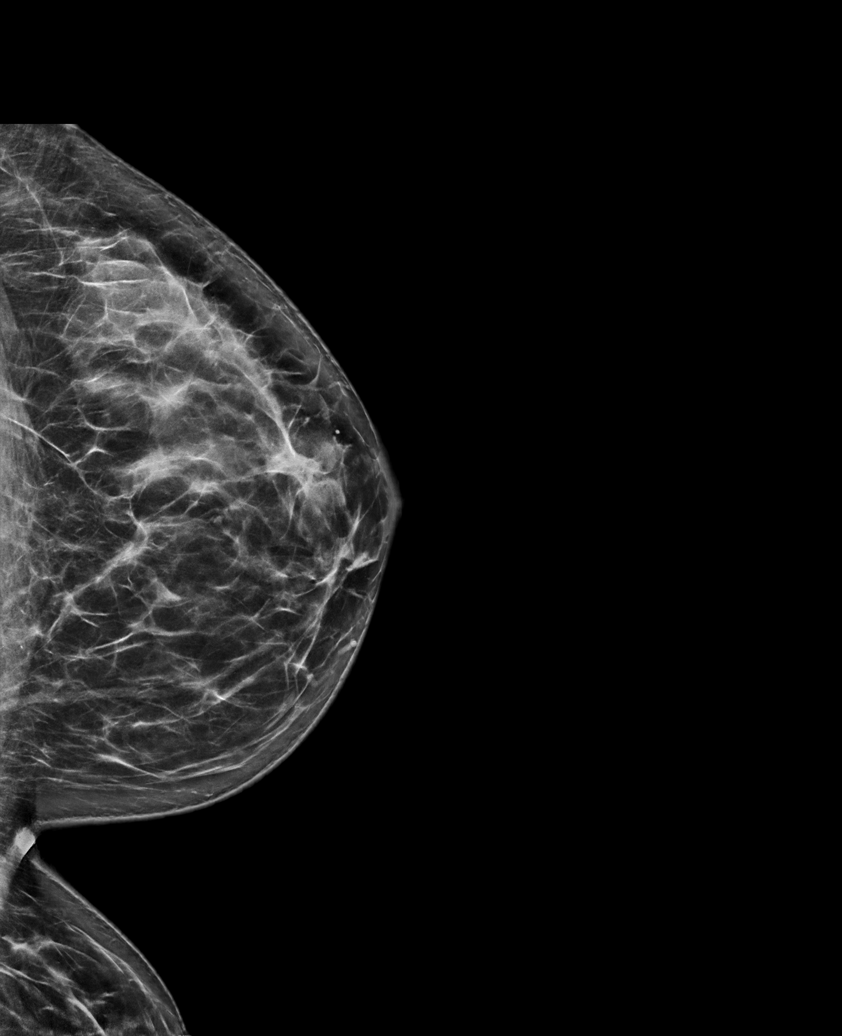

[R MLO synth-2D]
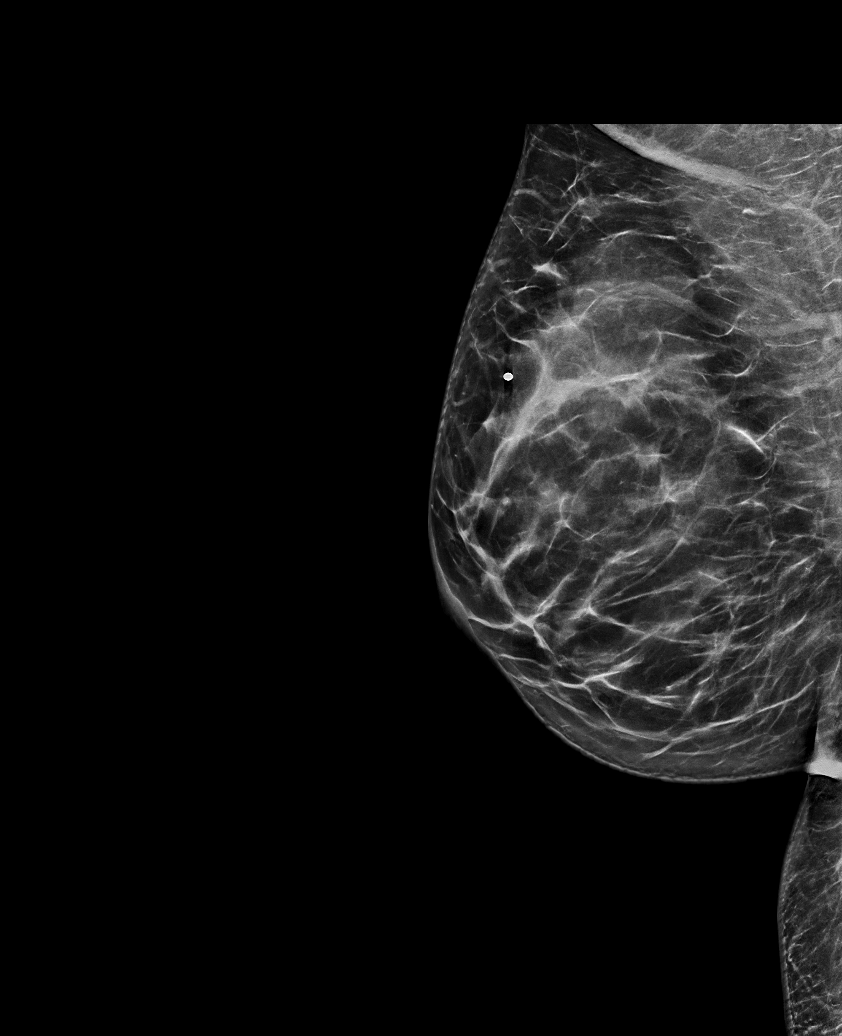

[R CC synth-2D]
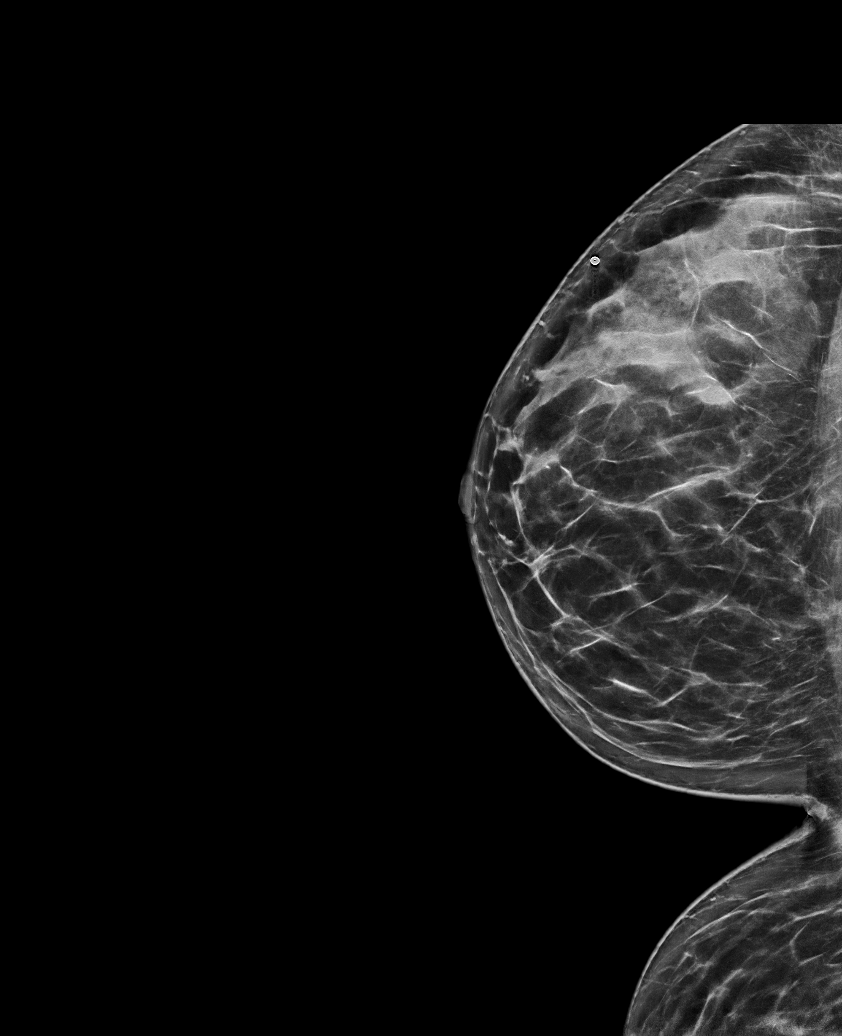

[L MLO synth-2D]
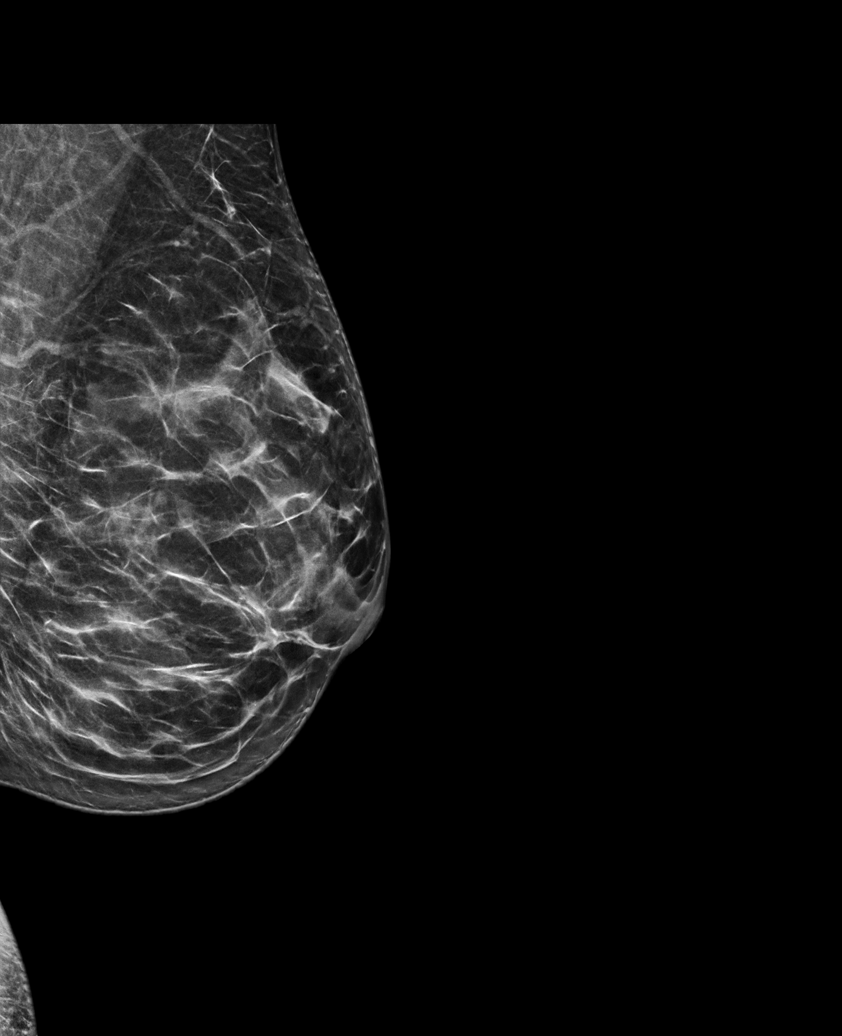

[R TAN synth-2D]
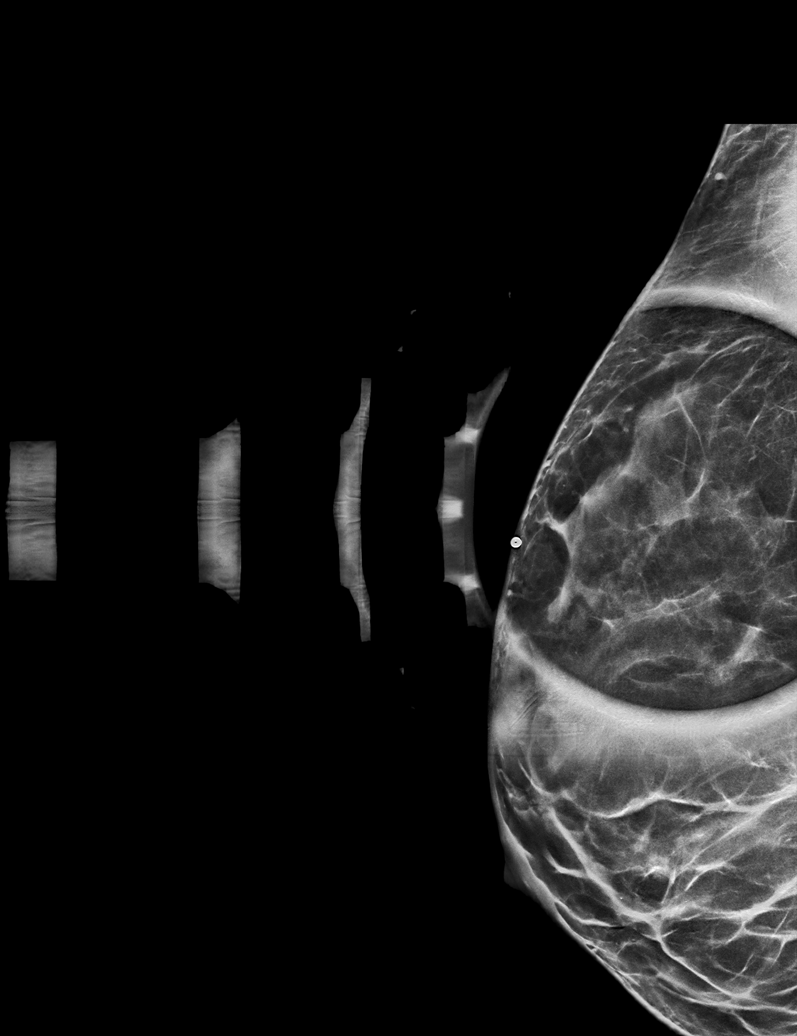

[L CC tomo · tomo slice 39/76.0]
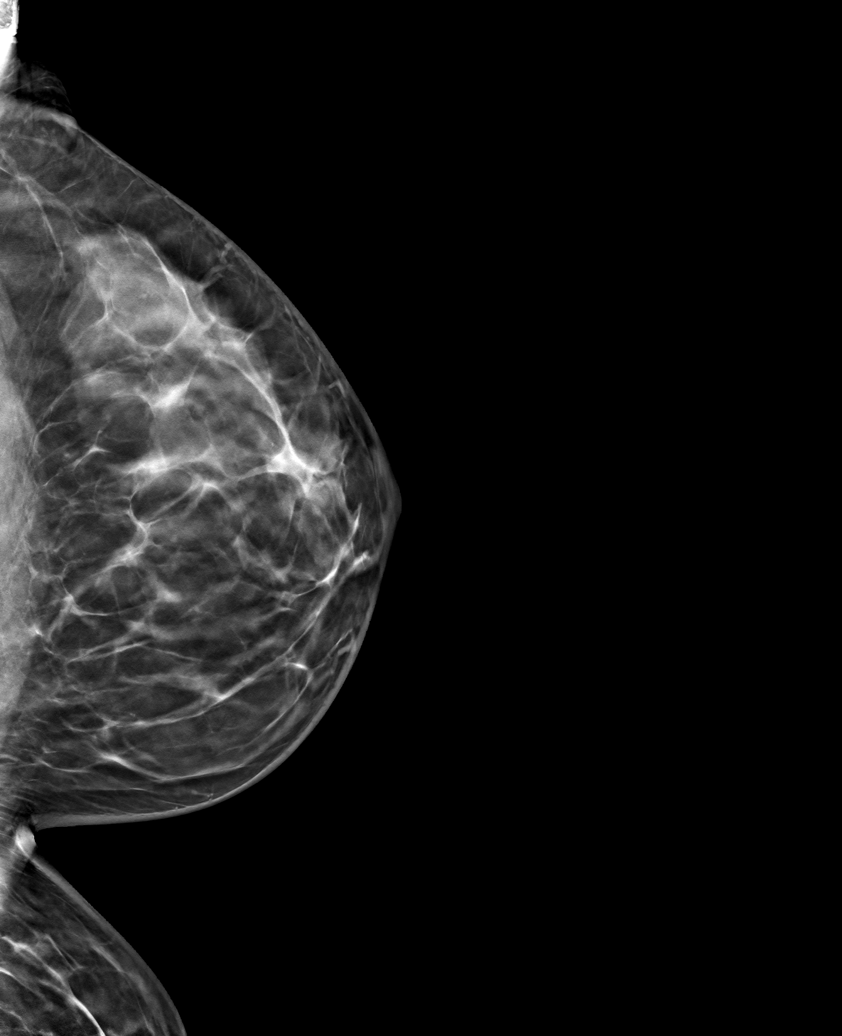

[6 of 30 positions shown; findings below may reference images not displayed]

ACR Breast Density Category c: The breast tissue is heterogeneously
dense, which may obscure small masses.
FINDINGS: A BB has been placed along the upper-outer aspect of the right
breast indicating the palpable site of concern. There are no
suspicious mammographic findings deep to the marker. No suspicious
calcifications, masses or areas of distortion are seen in the
bilateral breasts.

Mammographic images were processed with CAD.

Physical exam of the palpable site in the upper-outer right breast
demonstrates a broad firm ridge of tissue in the upper-outer
quadrant.

Ultrasound targeted to the upper-outer quadrant of the right breast
demonstrates normal dense fibroglandular tissue. No masses or
suspicious areas of shadowing are identified.
IMPRESSION: 1. There are no suspicious mammographic or targeted sonographic
abnormalities at the palpable site of concern in the right breast.

2.  No mammographic evidence of malignancy in the bilateral breasts.

RECOMMENDATION:
1. Clinical follow-up recommended for the palpable area of concern
in the upper-outer right breast. Any further workup should be based
on clinical grounds.

2. Screening mammogram at age 40 unless there are persistent or
intervening clinical concerns. (Code:LE-9-MXG)

I have discussed the findings and recommendations with the patient.
If applicable, a reminder letter will be sent to the patient
regarding the next appointment.

BI-RADS CATEGORY  1: Negative.

## 2023-05-30 IMAGING — US US BREAST*R* LIMITED INC AXILLA
1 series · 2 of 2 positions shown · non-contrast
Comparison: None.

CLINICAL DATA: 35-year-old female presenting for baseline mammogram
to evaluate a lump of right breast. She saw her doctor Hermie and
this has not changed in size since that time.

EXAM:
DIGITAL DIAGNOSTIC BILATERAL MAMMOGRAM WITH TOMOSYNTHESIS AND CAD;
ULTRASOUND RIGHT BREAST LIMITED
TECHNIQUE: Bilateral digital diagnostic mammography and breast tomosynthesis
was performed. The images were evaluated with computer-aided
detection.; Targeted ultrasound examination of the right breast was
performed

[Series 1: us breast*right* limited inc axilla · 0.07mm/px · 2 of 2 slices shown]
[im 1/2]
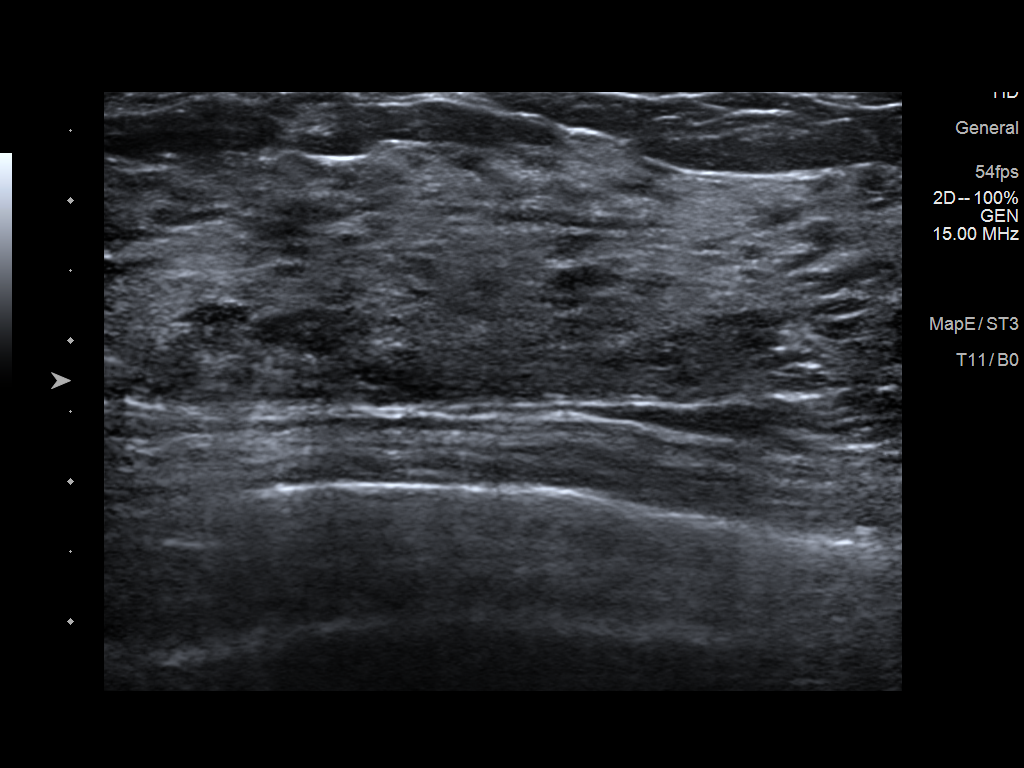
[im 2/2]
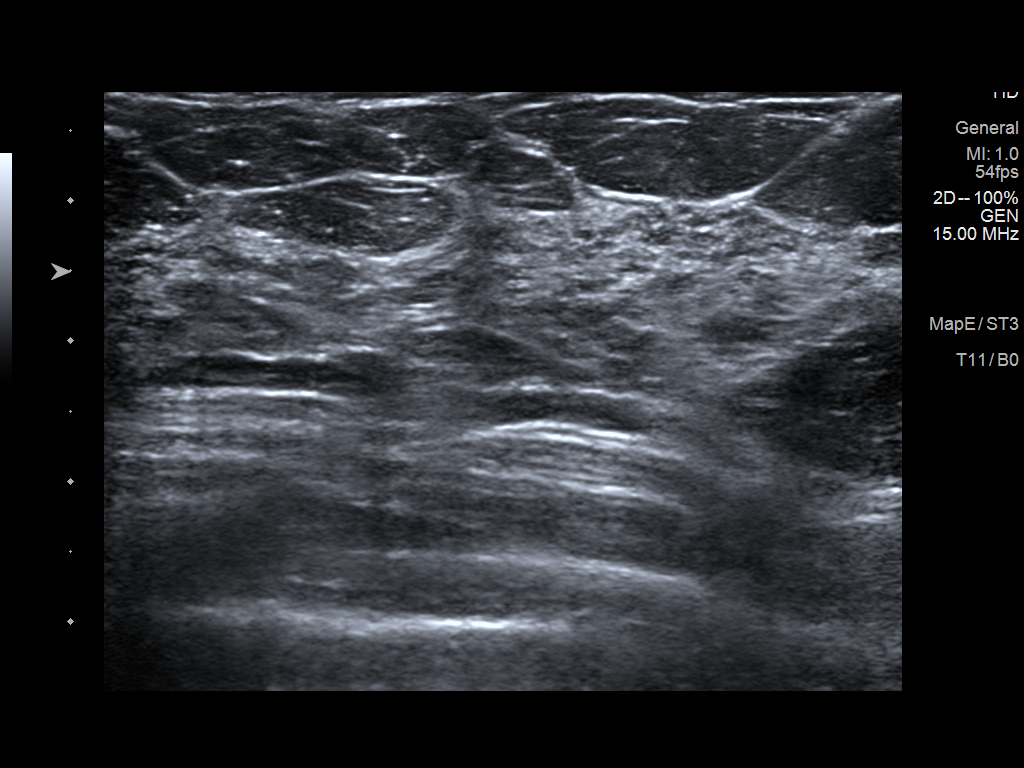

[2 of 2 positions shown; findings below may reference images not displayed]

ACR Breast Density Category c: The breast tissue is heterogeneously
dense, which may obscure small masses.
FINDINGS: A BB has been placed along the upper-outer aspect of the right
breast indicating the palpable site of concern. There are no
suspicious mammographic findings deep to the marker. No suspicious
calcifications, masses or areas of distortion are seen in the
bilateral breasts.

Mammographic images were processed with CAD.

Physical exam of the palpable site in the upper-outer right breast
demonstrates a broad firm ridge of tissue in the upper-outer
quadrant.

Ultrasound targeted to the upper-outer quadrant of the right breast
demonstrates normal dense fibroglandular tissue. No masses or
suspicious areas of shadowing are identified.
IMPRESSION: 1. There are no suspicious mammographic or targeted sonographic
abnormalities at the palpable site of concern in the right breast.

2.  No mammographic evidence of malignancy in the bilateral breasts.

RECOMMENDATION:
1. Clinical follow-up recommended for the palpable area of concern
in the upper-outer right breast. Any further workup should be based
on clinical grounds.

2. Screening mammogram at age 40 unless there are persistent or
intervening clinical concerns. (Code:LE-9-MXG)

I have discussed the findings and recommendations with the patient.
If applicable, a reminder letter will be sent to the patient
regarding the next appointment.

BI-RADS CATEGORY  1: Negative.

## 2023-07-26 ENCOUNTER — Encounter: Payer: Self-pay | Admitting: Family Medicine

## 2023-07-26 ENCOUNTER — Other Ambulatory Visit (HOSPITAL_COMMUNITY)
Admission: RE | Admit: 2023-07-26 | Discharge: 2023-07-26 | Disposition: A | Discharge status: Home / Self Care | Source: Ambulatory Visit | Attending: Family Medicine | Admitting: Family Medicine

## 2023-07-26 ENCOUNTER — Ambulatory Visit (INDEPENDENT_AMBULATORY_CARE_PROVIDER_SITE_OTHER): Payer: Managed Care, Other (non HMO) | Admitting: Family Medicine

## 2023-07-26 VITALS — BP 113/70 | HR 67 | Ht 67.0 in | Wt 180.0 lb

## 2023-07-26 DIAGNOSIS — Z01419 Encounter for gynecological examination (general) (routine) without abnormal findings: Secondary | ICD-10-CM

## 2023-07-26 NOTE — Addendum Note (Signed)
 Addended by: Mikey Bussing on: 07/26/2023 09:50 AM   Modules accepted: Orders

## 2023-07-26 NOTE — Progress Notes (Signed)
 ANNUAL EXAM Patient name: Erica Shelton MRN [id number]  Date of birth: [birth year] Chief Complaint:   Annual Exam  History of Present Illness:   Erica Shelton is a 38 y.o.  [phone]  female  being seen today for a routine annual exam.  Current complaints: none  Patient's last menstrual period was 07/14/2023 (exact date).    Last pap 2022. Results were: NILM w/ HRHPV negative. H/O abnormal pap: no Last mammogram:      07/26/2023    9:32 AM 02/02/2022    8:33 AM 09/24/2021    9:19 AM 04/02/2021    1:32 PM 12/03/2019    1:48 PM  Depression screen PHQ 2/9  Decreased Interest 0 0 0 0 0  Down, Depressed, Hopeless 0 0 0 0 0  PHQ - 2 Score 0 0 0 0 0  Altered sleeping 0 0 0    Tired, decreased energy 0 0 0    Change in appetite 0 0 2    Feeling bad or failure about yourself  0 0 0    Trouble concentrating 0 0 0    Moving slowly or fidgety/restless 0 0 0    Suicidal thoughts 0 0 0    PHQ-9 Score 0 0 2          07/26/2023    9:32 AM 02/02/2022    8:33 AM 09/24/2021    9:19 AM  GAD 7 : Generalized Anxiety Score  Nervous, Anxious, on Edge 0 0 0  Control/stop worrying 0 0 0  Worry too much - different things 0 0 0  Trouble relaxing 0 0 0  Restless 0 0 0  Easily annoyed or irritable 0 0 1  Afraid - awful might happen 0 0 0  Total GAD 7 Score 0 0 1     Review of Systems:   Pertinent items are noted in HPI Denies any headaches, blurred vision, fatigue, shortness of breath, chest pain, abdominal pain, abnormal vaginal discharge/itching/odor/irritation, problems with periods, bowel movements, urination, or intercourse unless otherwise stated above. Pertinent History Reviewed:  Reviewed past medical,surgical, social and family history.  Reviewed problem list, medications and allergies. Physical Assessment:   Vitals:   07/26/23 0927  BP: 113/70  Pulse: 67  Weight: 180 lb (81.6 kg)  Height: 5\' 7"  (1.702 m)  Body mass index is 28.19 kg/m.        Physical Examination:    General appearance - well appearing, and in no distress  Mental status - alert, oriented to person, place, and time  Psych:  She has a normal mood and affect  Skin - warm and dry, normal color, no suspicious lesions noted  Chest - effort normal, all lung fields clear to auscultation bilaterally  Heart - normal rate and regular rhythm  Neck:  midline trachea, no thyromegaly or nodules  Breasts - breasts appear normal, no suspicious masses, no skin or nipple changes or axillary nodes  Abdomen - soft, nontender, nondistended, no masses or organomegaly  Pelvic - VULVA: normal appearing vulva with no masses, tenderness or lesions  VAGINA: normal appearing vagina with normal color and discharge, no lesions  CERVIX: normal appearing cervix without discharge or lesions, no CMT  Thin prep pap is done   UTERUS: uterus is felt to be normal size, shape, consistency and nontender   ADNEXA: No adnexal masses or tenderness noted.  Extremities:  No swelling or varicosities noted  Chaperone present for exam  Assessment & Plan:  1.  Well woman exam with routine gynecological exam (Primary)  - Cytology - PAP( [place]) - CBC - Comp Met ([facility]) - TSH Rfx on Abnormal to Free T4 - Lipid panel   Labs/procedures today:   Orders Placed This Encounter  Procedures   CBC   Comp Met ([facility]ET)   TSH Rfx on Abnormal to Free T4   Lipid panel    Meds: No orders of the defined types were placed in this encounter.   Follow-up: No follow-ups on file.  Levie Heritage, DO 07/26/2023 9:47 AM

## 2023-07-27 LAB — COMPREHENSIVE METABOLIC PANEL
ALT: 10 IU/L (ref 0–32)
AST: 17 IU/L (ref 0–40)
Albumin: 4.5 g/dL (ref 3.9–4.9)
Alkaline Phosphatase: 91 IU/L (ref 44–121)
BUN/Creatinine Ratio: 14 (ref 9–23)
BUN: 9 mg/dL (ref 6–20)
Bilirubin Total: 0.4 mg/dL (ref 0.0–1.2)
CO2: 23 mmol/L (ref 20–29)
Calcium: 9.2 mg/dL (ref 8.7–10.2)
Chloride: 102 mmol/L (ref 96–106)
Creatinine, Ser: 0.66 mg/dL (ref 0.57–1.00)
Globulin, Total: 2.4 g/dL (ref 1.5–4.5)
Glucose: 83 mg/dL (ref 70–99)
Potassium: 4.2 mmol/L (ref 3.5–5.2)
Sodium: 139 mmol/L (ref 134–144)
Total Protein: 6.9 g/dL (ref 6.0–8.5)
eGFR: 115 mL/min/{1.73_m2} (ref >59)

## 2023-07-27 LAB — LIPID PANEL
Chol/HDL Ratio: 2.5 ratio (ref 0.0–4.4)
Cholesterol, Total: 164 mg/dL (ref 100–199)
HDL: 66 mg/dL (ref >39)
LDL Chol Calc (NIH): 87 mg/dL (ref 0–99)
Triglycerides: 52 mg/dL (ref 0–149)
VLDL Cholesterol Cal: 11 mg/dL (ref 5–40)

## 2023-07-27 LAB — CBC
Hematocrit: 38.2 % (ref 34.0–46.6)
Hemoglobin: 12.4 g/dL (ref 11.1–15.9)
MCH: 29.4 pg (ref 26.6–33.0)
MCHC: 32.5 g/dL (ref 31.5–35.7)
MCV: 91 fL (ref 79–97)
Platelets: 295 10*3/uL (ref 150–450)
RBC: 4.22 x10E6/uL (ref 3.77–5.28)
RDW: 12.7 % (ref 11.7–15.4)
WBC: 5.2 10*3/uL (ref 3.4–10.8)

## 2023-07-27 LAB — TSH RFX ON ABNORMAL TO FREE T4: TSH: 2.34 u[IU]/mL (ref 0.450–4.500)

## 2023-07-28 ENCOUNTER — Encounter: Payer: Self-pay | Admitting: Family Medicine

## 2023-07-28 LAB — CYTOLOGY - PAP
Comment: NEGATIVE
Diagnosis: NEGATIVE
High risk HPV: NEGATIVE
